# Patient Record
Sex: Female | Born: 1960 | Race: White | Hispanic: No | Marital: Married | Smoking: Never smoker
Health system: Southern US, Community
[De-identification: ages and names within clinical notes are randomized; demographics above are authoritative.]

## PROBLEM LIST (undated history)

## (undated) DIAGNOSIS — N840 Polyp of corpus uteri: Secondary | ICD-10-CM

## (undated) DIAGNOSIS — N926 Irregular menstruation, unspecified: Secondary | ICD-10-CM

## (undated) DIAGNOSIS — B029 Zoster without complications: Secondary | ICD-10-CM

## (undated) DIAGNOSIS — R87619 Unspecified abnormal cytological findings in specimens from cervix uteri: Secondary | ICD-10-CM

## (undated) DIAGNOSIS — Z87442 Personal history of urinary calculi: Secondary | ICD-10-CM

## (undated) DIAGNOSIS — N200 Calculus of kidney: Secondary | ICD-10-CM

## (undated) DIAGNOSIS — Z9289 Personal history of other medical treatment: Secondary | ICD-10-CM

## (undated) DIAGNOSIS — K7689 Other specified diseases of liver: Secondary | ICD-10-CM

## (undated) DIAGNOSIS — Z8619 Personal history of other infectious and parasitic diseases: Secondary | ICD-10-CM

## (undated) DIAGNOSIS — L739 Follicular disorder, unspecified: Secondary | ICD-10-CM

## (undated) DIAGNOSIS — U071 COVID-19: Secondary | ICD-10-CM

## (undated) HISTORY — DX: COVID-19: U07.1

## (undated) HISTORY — DX: Unspecified abnormal cytological findings in specimens from cervix uteri: R87.619

## (undated) HISTORY — DX: Follicular disorder, unspecified: L73.9

## (undated) HISTORY — PX: HAND SURGERY: SHX662

## (undated) HISTORY — DX: Zoster without complications: B02.9

## (undated) HISTORY — DX: Personal history of other medical treatment: Z92.89

## (undated) HISTORY — DX: Personal history of other infectious and parasitic diseases: Z86.19

## (undated) HISTORY — PX: FOOT SURGERY: SHX648

## (undated) HISTORY — DX: Calculus of kidney: N20.0

## (undated) HISTORY — PX: KNEE ARTHROSCOPY: SUR90

## (undated) HISTORY — PX: DILATION AND CURETTAGE OF UTERUS: SHX78

## (undated) HISTORY — DX: Polyp of corpus uteri: N84.0

## (undated) HISTORY — PX: CERVICAL POLYPECTOMY: SHX88

## (undated) HISTORY — PX: HERNIA REPAIR: SHX51

## (undated) HISTORY — DX: Irregular menstruation, unspecified: N92.6

## (undated) HISTORY — PX: TUBAL LIGATION: SHX77

---

## 2000-06-25 DIAGNOSIS — R87619 Unspecified abnormal cytological findings in specimens from cervix uteri: Secondary | ICD-10-CM

## 2000-06-25 HISTORY — PX: CRYOTHERAPY: SHX1416

## 2000-06-25 HISTORY — PX: COLPOSCOPY: SHX161

## 2000-06-25 HISTORY — DX: Unspecified abnormal cytological findings in specimens from cervix uteri: R87.619

## 2011-03-14 DIAGNOSIS — Z9289 Personal history of other medical treatment: Secondary | ICD-10-CM

## 2011-03-14 HISTORY — DX: Personal history of other medical treatment: Z92.89

## 2011-05-08 ENCOUNTER — Ambulatory Visit: Payer: Self-pay | Admitting: Family Medicine

## 2011-05-08 DIAGNOSIS — Z9289 Personal history of other medical treatment: Secondary | ICD-10-CM

## 2011-05-08 HISTORY — DX: Personal history of other medical treatment: Z92.89

## 2012-06-12 ENCOUNTER — Ambulatory Visit: Payer: Self-pay | Admitting: Family Medicine

## 2012-06-16 ENCOUNTER — Ambulatory Visit: Payer: Self-pay | Admitting: Family Medicine

## 2013-07-22 ENCOUNTER — Ambulatory Visit: Payer: Self-pay | Admitting: Family Medicine

## 2013-11-02 ENCOUNTER — Ambulatory Visit: Payer: Self-pay | Admitting: Obstetrics and Gynecology

## 2013-11-11 ENCOUNTER — Ambulatory Visit: Payer: Self-pay | Admitting: Obstetrics and Gynecology

## 2014-05-18 ENCOUNTER — Ambulatory Visit: Payer: Self-pay | Admitting: Obstetrics and Gynecology

## 2014-09-27 ENCOUNTER — Emergency Department
Admit: 2014-09-27 | Disposition: A | Discharge status: Home / Self Care | Payer: Self-pay | Admitting: Emergency Medicine

## 2014-09-27 LAB — CBC
HCT: 38.7 % (ref 35.0–47.0)
HGB: 12.6 g/dL (ref 12.0–16.0)
MCH: 31.1 pg (ref 26.0–34.0)
MCHC: 32.6 g/dL (ref 32.0–36.0)
MCV: 96 fL (ref 80–100)
Platelet: 222 10*3/uL (ref 150–440)
RBC: 4.06 10*6/uL (ref 3.80–5.20)
RDW: 13 % (ref 11.5–14.5)
WBC: 5.6 10*3/uL (ref 3.6–11.0)

## 2014-12-03 ENCOUNTER — Other Ambulatory Visit: Payer: Self-pay | Admitting: Obstetrics & Gynecology

## 2014-12-03 DIAGNOSIS — N63 Unspecified lump in unspecified breast: Secondary | ICD-10-CM

## 2014-12-13 ENCOUNTER — Other Ambulatory Visit: Payer: Self-pay

## 2014-12-13 ENCOUNTER — Encounter: Payer: Self-pay | Admitting: *Deleted

## 2014-12-13 DIAGNOSIS — Z808 Family history of malignant neoplasm of other organs or systems: Secondary | ICD-10-CM | POA: Diagnosis not present

## 2014-12-13 DIAGNOSIS — N920 Excessive and frequent menstruation with regular cycle: Secondary | ICD-10-CM | POA: Diagnosis not present

## 2014-12-13 DIAGNOSIS — Z803 Family history of malignant neoplasm of breast: Secondary | ICD-10-CM | POA: Diagnosis not present

## 2014-12-13 DIAGNOSIS — N938 Other specified abnormal uterine and vaginal bleeding: Secondary | ICD-10-CM | POA: Diagnosis present

## 2014-12-13 DIAGNOSIS — Z882 Allergy status to sulfonamides status: Secondary | ICD-10-CM | POA: Diagnosis not present

## 2014-12-13 DIAGNOSIS — Z79899 Other long term (current) drug therapy: Secondary | ICD-10-CM | POA: Diagnosis not present

## 2014-12-13 DIAGNOSIS — Z91010 Allergy to peanuts: Secondary | ICD-10-CM | POA: Diagnosis not present

## 2014-12-13 DIAGNOSIS — N926 Irregular menstruation, unspecified: Secondary | ICD-10-CM | POA: Diagnosis not present

## 2014-12-13 NOTE — Patient Instructions (Signed)
  Your procedure is scheduled on: 12/21/14 Report to Day Surgery. To find out your arrival time please call [phone] between 1PM - 3PM on 12/20/14.  Remember: Instructions that are not followed completely may result in serious medical risk, up to and including death, or upon the discretion of your surgeon and anesthesiologist your surgery may need to be rescheduled.    __x__ 1. Do not eat food or drink liquids after midnight. No gum chewing or hard candies.     __x__ 2. No Alcohol for 24 hours before or after surgery.   ____ 3. Bring all medications with you on the day of surgery if instructed.    __x__ 4. Notify your doctor if there is any change in your medical condition     (cold, fever, infections).     Do not wear jewelry, make-up, hairpins, clips or nail polish.  Do not wear lotions, powders, or perfumes. You may wear deodorant.  Do not shave 48 hours prior to surgery. Men may shave face and neck.  Do not bring valuables to the hospital.    [facility] is not responsible for any belongings or valuables.               Contacts, dentures or bridgework may not be worn into surgery.  Leave your suitcase in the car. After surgery it may be brought to your room.  For patients admitted to the hospital, discharge time is determined by your                treatment team.   Patients discharged the day of surgery will not be allowed to drive home.   Please read over the following fact sheets that you were given:   Surgical Site Infection Prevention   ____ Take these medicines the morning of surgery with A SIP OF WATER:    1.   2.   3.   4.  5.  6.  ____ Fleet Enema (as directed)   ____ Use CHG Soap as directed  ____ Use inhalers on the day of surgery  ____ Stop metformin 2 days prior to surgery    ____ Take 1/2 of usual insulin dose the night before surgery and none on the morning of surgery.   ____ Stop Coumadin/Plavix/aspirin on ____ Stop Anti-inflammatories on     ____ Stop supplements until after surgery.    ____ Bring C-Pap to the hospital.

## 2014-12-15 ENCOUNTER — Other Ambulatory Visit: Payer: Self-pay

## 2014-12-15 ENCOUNTER — Ambulatory Visit: Payer: Self-pay

## 2014-12-20 ENCOUNTER — Encounter
Admission: RE | Admit: 2014-12-20 | Discharge: 2014-12-20 | Disposition: A | Discharge status: Home / Self Care | Payer: BLUE CROSS/BLUE SHIELD | Source: Ambulatory Visit | Attending: Obstetrics & Gynecology | Admitting: Obstetrics & Gynecology

## 2014-12-20 DIAGNOSIS — N926 Irregular menstruation, unspecified: Secondary | ICD-10-CM | POA: Diagnosis not present

## 2014-12-20 LAB — TYPE AND SCREEN
ABO/RH(D): O POS
ANTIBODY SCREEN: NEGATIVE

## 2014-12-20 LAB — ABO/RH: ABO/RH(D): O POS

## 2014-12-21 ENCOUNTER — Ambulatory Visit
Admission: RE | Admit: 2014-12-21 | Discharge: 2014-12-21 | Disposition: A | Discharge status: Home / Self Care | Payer: BLUE CROSS/BLUE SHIELD | Source: Ambulatory Visit | Attending: Obstetrics & Gynecology | Admitting: Obstetrics & Gynecology

## 2014-12-21 ENCOUNTER — Encounter: Payer: Self-pay | Admitting: *Deleted

## 2014-12-21 ENCOUNTER — Encounter
Admission: RE | Disposition: A | Discharge status: Home / Self Care | Payer: Self-pay | Source: Ambulatory Visit | Attending: Obstetrics & Gynecology

## 2014-12-21 ENCOUNTER — Ambulatory Visit: Payer: BLUE CROSS/BLUE SHIELD | Admitting: Anesthesiology

## 2014-12-21 DIAGNOSIS — Z91010 Allergy to peanuts: Secondary | ICD-10-CM | POA: Insufficient documentation

## 2014-12-21 DIAGNOSIS — Z882 Allergy status to sulfonamides status: Secondary | ICD-10-CM | POA: Insufficient documentation

## 2014-12-21 DIAGNOSIS — N926 Irregular menstruation, unspecified: Secondary | ICD-10-CM | POA: Insufficient documentation

## 2014-12-21 DIAGNOSIS — Z803 Family history of malignant neoplasm of breast: Secondary | ICD-10-CM | POA: Insufficient documentation

## 2014-12-21 DIAGNOSIS — Z808 Family history of malignant neoplasm of other organs or systems: Secondary | ICD-10-CM | POA: Insufficient documentation

## 2014-12-21 DIAGNOSIS — N920 Excessive and frequent menstruation with regular cycle: Secondary | ICD-10-CM | POA: Insufficient documentation

## 2014-12-21 DIAGNOSIS — Z79899 Other long term (current) drug therapy: Secondary | ICD-10-CM | POA: Insufficient documentation

## 2014-12-21 HISTORY — DX: Other specified diseases of liver: K76.89

## 2014-12-21 HISTORY — PX: HYSTEROSCOPY WITH D & C: SHX1775

## 2014-12-21 LAB — POCT PREGNANCY, URINE: PREG TEST UR: NEGATIVE

## 2014-12-21 SURGERY — DILATATION AND CURETTAGE /HYSTEROSCOPY
Anesthesia: General | Wound class: Clean Contaminated

## 2014-12-21 MED ORDER — LACTATED RINGERS IV SOLN
INTRAVENOUS | Status: DC
  Administered 2014-12-21: 10:00:00 via INTRAVENOUS

## 2014-12-21 MED ORDER — EPHEDRINE SULFATE 50 MG/ML IJ SOLN
INTRAMUSCULAR | Status: DC | PRN
  Administered 2014-12-21 (×2): 5 mg via INTRAVENOUS

## 2014-12-21 MED ORDER — FENTANYL CITRATE (PF) 100 MCG/2ML IJ SOLN
INTRAMUSCULAR | Status: AC
  Administered 2014-12-21: 25 ug via INTRAVENOUS
  Filled 2014-12-21: qty 2

## 2014-12-21 MED ORDER — FENTANYL CITRATE (PF) 100 MCG/2ML IJ SOLN
INTRAMUSCULAR | Status: DC | PRN
  Administered 2014-12-21 (×2): 25 ug via INTRAVENOUS

## 2014-12-21 MED ORDER — DEXAMETHASONE SODIUM PHOSPHATE 4 MG/ML IJ SOLN
INTRAMUSCULAR | Status: DC | PRN
  Administered 2014-12-21: 5 mg via INTRAVENOUS

## 2014-12-21 MED ORDER — LIDOCAINE HCL (CARDIAC) 20 MG/ML IV SOLN
INTRAVENOUS | Status: DC | PRN
  Administered 2014-12-21: 80 mg via INTRAVENOUS

## 2014-12-21 MED ORDER — ONDANSETRON HCL 4 MG/2ML IJ SOLN
INTRAMUSCULAR | Status: DC | PRN
  Administered 2014-12-21: 4 mg via INTRAVENOUS

## 2014-12-21 MED ORDER — FENTANYL CITRATE (PF) 100 MCG/2ML IJ SOLN: 25.0000 ug | INTRAMUSCULAR | Status: DC | PRN

## 2014-12-21 MED ORDER — MIDAZOLAM HCL 2 MG/2ML IJ SOLN
INTRAMUSCULAR | Status: DC | PRN
  Administered 2014-12-21: 2 mg via INTRAVENOUS

## 2014-12-21 MED ORDER — FAMOTIDINE 20 MG PO TABS
20.0000 mg | ORAL_TABLET | Freq: Once | ORAL | Status: DC
Start: 2014-12-21 — End: 2014-12-21

## 2014-12-21 MED ORDER — LIDOCAINE HCL (PF) 1 % IJ SOLN
INTRAMUSCULAR | Status: AC
  Filled 2014-12-21: qty 30

## 2014-12-21 MED ORDER — LACTATED RINGERS IR SOLN
Status: DC | PRN
  Administered 2014-12-21: 500 mL

## 2014-12-21 MED ORDER — FENTANYL CITRATE (PF) 100 MCG/2ML IJ SOLN
25.0000 ug | INTRAMUSCULAR | Status: DC | PRN
  Administered 2014-12-21 (×2): 25 ug via INTRAVENOUS

## 2014-12-21 MED ORDER — LIDOCAINE HCL 1 % IJ SOLN
INTRAMUSCULAR | Status: DC | PRN
  Administered 2014-12-21: 10 mL

## 2014-12-21 MED ORDER — FAMOTIDINE 20 MG PO TABS
ORAL_TABLET | ORAL | Status: AC
Start: 2014-12-21 — End: 2014-12-21
  Administered 2014-12-21: 20 mg
  Filled 2014-12-21: qty 1

## 2014-12-21 MED ORDER — PROPOFOL 10 MG/ML IV BOLUS
INTRAVENOUS | Status: DC | PRN
  Administered 2014-12-21: 200 mg via INTRAVENOUS

## 2014-12-21 MED ORDER — ONDANSETRON HCL 4 MG/2ML IJ SOLN: 4.0000 mg | Freq: Once | INTRAMUSCULAR | Status: DC | PRN

## 2014-12-21 SURGICAL SUPPLY — 19 items
CATH ROBINSON RED A/P 16FR (CATHETERS) ×3 IMPLANT
CORD URO TURP 10FT (MISCELLANEOUS) IMPLANT
ELECT RESECT POWERBALL 24F (MISCELLANEOUS) IMPLANT
GLOVE BIOGEL PI IND STRL 6.5 (GLOVE) ×2 IMPLANT
GLOVE BIOGEL PI INDICATOR 6.5 (GLOVE) ×4
GLOVE SURG SYN 6.5 ES PF (GLOVE) ×9 IMPLANT
GOWN STRL REUS W/ TWL LRG LVL3 (GOWN DISPOSABLE) ×2 IMPLANT
GOWN STRL REUS W/TWL LRG LVL3 (GOWN DISPOSABLE) ×4
IV LACTATED RINGERS 1000ML (IV SOLUTION) ×3 IMPLANT
KIT RM TURNOVER CYSTO AR (KITS) ×3 IMPLANT
NEEDLE SPNL 22GX3.5 QUINCKE BK (NEEDLE) ×3 IMPLANT
PACK DNC HYST (MISCELLANEOUS) ×3 IMPLANT
PAD GROUND ADULT SPLIT (MISCELLANEOUS) ×3 IMPLANT
PAD OB MATERNITY 4.3X12.25 (PERSONAL CARE ITEMS) ×3 IMPLANT
PAD PREP 24X41 OB/GYN DISP (PERSONAL CARE ITEMS) ×3 IMPLANT
SYR CONTROL 10ML (SYRINGE) ×3 IMPLANT
TUBING CONNECTING 10 (TUBING) ×2 IMPLANT
TUBING CONNECTING 10' (TUBING) ×1
TUBING HYSTEROSCOPY DOLPHIN (MISCELLANEOUS) IMPLANT

## 2014-12-21 NOTE — Op Note (Signed)
Operative Report Hysteroscopy, Dilation and Curettage 12/21/2014  Patient:  Karen Chapman  54 y.o. female Preoperative diagnosis:  IRREGULAR BLEEDING Postoperative diagnosis:  IRREGULAR BLEEDING  PROCEDURE:  Procedure(s): DILATATION AND CURETTAGE /HYSTEROSCOPY (N/A) Surgeon:  Surgeon(s) and Role:    * Kaydan Wilhoite Loletha Grayer Laryah Neuser, MD - Primary  Anesthesia:  LMA I/O: Total I/O In: 700 [I.V.:700] Out: - EBL minimal, no UOP Specimens:  Endometrial curettings, Endocervical curettings Complications: None Apparent Disposition:  VS stable to PACU  Findings: Uterus, mobile, normal size, sounding to 9 cm; normal cervix, vagina, perineum. Fluffy endometrium on hysteroscope, no polyp, patent tubal ostea.  Indication for procedure/Consents: 54 y.o.  here for scheduled surgery for the aforementioned diagnoses.  Risks of surgery were discussed with the patient including but not limited to: bleeding which may require transfusion; infection which may require antibiotics; injury to uterus or surrounding organs; intrauterine scarring which may impair future fertility; need for additional procedures including laparotomy or laparoscopy; and other postoperative/anesthesia complications. Written informed consent was obtained.    Procedure Details:   The patient was then taken to the operating room where anesthesia was administered and was found to be adequate.  After a formal and adequate timeout was performed, she was placed in the dorsal lithotomy position and examined with the above findings. She was then prepped and draped in the sterile manner.  A speculum was then placed in the patient's vagina and a single tooth tenaculum was applied to the anterior lip of the cervix.  A paracervical block was placed.  An endocervical curettage was performed. The uterus was sounded to 9 cm. Her cervix was serially dilated to accommodate the hysteroscope, with findings as above. A sharp curettage was then performed until there was a  gritty texture in all four quadrants. The specimens were handed off to nursing.  The camera was reinserted and confirmed the uterus had been evacuated. The tenaculum was removed from the anterior lip of the cervix and the vaginal speculum was removed after noting good hemostasis. The patient tolerated the procedure well and was taken to the recovery area awake, extubated and in stable condition.  The patient will be discharged to home as per PACU criteria.  Routine postoperative instructions given. She will follow up in the clinic in two to four weeks for postoperative evaluation.  Larey Days, MD [facility] Attending Gynecologist

## 2014-12-21 NOTE — Anesthesia Postprocedure Evaluation (Signed)
  Anesthesia Post-op Note  Patient: Karen Chapman  Procedure(s) Performed: Procedure(s): DILATATION AND CURETTAGE /HYSTEROSCOPY (N/A)  Anesthesia type:General LMA  Patient location: PACU  Post pain: Pain level controlled  Post assessment: Post-op Vital signs reviewed, Patient's Cardiovascular Status Stable, Respiratory Function Stable, Patent Airway and No signs of Nausea or vomiting  Post vital signs: Reviewed and stable  Last Vitals:  Filed Vitals:   12/21/14 1202  BP: 133/69  Pulse: 60  Temp:   Resp: 16    Level of consciousness: awake, alert  and patient cooperative  Complications: No apparent anesthesia complications

## 2014-12-21 NOTE — Progress Notes (Signed)
H&P Update  Pt was last seen in my office, and complete history and physical performed.  The surgical history has been reviewed and remains accurate without interval change. The patient was re-examined and patient's physiologic condition has not changed significantly in the last 30 days.  No new pharmacological allergies or types of therapy has been initiated.  Allergies  Allergen Reactions  . Chocolate   . Citrus   . Motrin [Ibuprofen] Hives  . Peanut-Containing Drug Products Hives  . Tomato   . Sulfa Antibiotics Rash   @CURRENTMEDS @ Past Medical History  Diagnosis Date  . Liver cyst    Past Surgical History  Procedure Laterality Date  . Cesarean section    . Dilation and curettage of uterus    . Foot surgery    . Hand surgery    . Hernia repair    . Knee arthroscopy    . Tubal ligation      BP 141/87 mmHg  Pulse 66  Temp(Src) 97.8 F (36.6 C) (Oral)  Resp 16  Ht 5' 6.6" (1.692 m)  Wt 71.668 kg (158 lb)  BMI 25.03 kg/m2  SpO2 100%  LMP 11/28/2014 (Exact Date)  NAD RRR no murmurs CTAB, no wheezing, resps unlabored +BS, soft, NTTP No c/c/e Pelvic exam deferred  The above history was confirmed with the patient. The condition still exists that makes this procedure necessary. Surgical plan includes D&C, hysteroscopy, polypectomy as confirmed on the consent. The treatment plan remains the same, without new options for care.  The patient understands the potential benefits and risks and the consents have been signed and placed on the chart.     Larey Days, MD Attending Obstetrician Gynecologist [facility]

## 2014-12-21 NOTE — Anesthesia Preprocedure Evaluation (Signed)
Anesthesia Evaluation  Patient identified by MRN, date of birth, ID band Patient awake    Reviewed: Allergy & Precautions, H&P , NPO status , Patient's Chart, lab work & pertinent test results, reviewed documented beta blocker date and time   Airway Mallampati: II  TM Distance: >3 FB Neck ROM: full    Dental   Pulmonary Current Smoker,          Cardiovascular Rate:Normal     Neuro/Psych    GI/Hepatic   Endo/Other    Renal/GU      Musculoskeletal   Abdominal   Peds  Hematology   Anesthesia Other Findings   Reproductive/Obstetrics                             Anesthesia Physical Anesthesia Plan  ASA: II  Anesthesia Plan: General LMA   Post-op Pain Management:    Induction:   Airway Management Planned:   Additional Equipment:   Intra-op Plan:   Post-operative Plan:   Informed Consent: I have reviewed the patients History and Physical, chart, labs and discussed the procedure including the risks, benefits and alternatives for the proposed anesthesia with the patient or authorized representative who has indicated his/her understanding and acceptance.     Plan Discussed with: CRNA  Anesthesia Plan Comments:         Anesthesia Quick Evaluation

## 2014-12-21 NOTE — Discharge Instructions (Signed)
You should expect to have some cramping and vaginal bleeding for about a week. This should taper off and subside, much like a period. If heavy bleeding continues or gets worse, you should contact the office for an earlier appointment. Use heating pads and tylenol for pain/cramping as needed.  Please call the office or physician on call for fever >101, severe pain, and heavy bleeding.   [phone]

## 2014-12-21 NOTE — Transfer of Care (Signed)
Immediate Anesthesia Transfer of Care Note  Patient: Karen Chapman  Procedure(s) Performed: Procedure(s): DILATATION AND CURETTAGE /HYSTEROSCOPY (N/A)  Patient Location: PACU  Anesthesia Type:General  Level of Consciousness: sedated  Airway & Oxygen Therapy: Patient Spontanous Breathing and Patient connected to face mask oxygen  Post-op Assessment: Report given to RN and Post -op Vital signs reviewed and stable  Post vital signs: Reviewed and stable  Last Vitals:  Filed Vitals:   12/21/14 1045  BP: 112/77  Pulse: 69  Temp: 36.1 C  Resp: 15    Complications: No apparent anesthesia complications

## 2014-12-22 LAB — SURGICAL PATHOLOGY

## 2014-12-23 ENCOUNTER — Ambulatory Visit: Payer: Self-pay

## 2014-12-23 ENCOUNTER — Ambulatory Visit
Admission: RE | Admit: 2014-12-23 | Discharge: 2014-12-23 | Disposition: A | Discharge status: Home / Self Care | Payer: BLUE CROSS/BLUE SHIELD | Source: Ambulatory Visit | Attending: Obstetrics & Gynecology | Admitting: Obstetrics & Gynecology

## 2014-12-23 DIAGNOSIS — Z803 Family history of malignant neoplasm of breast: Secondary | ICD-10-CM | POA: Insufficient documentation

## 2014-12-23 DIAGNOSIS — Z09 Encounter for follow-up examination after completed treatment for conditions other than malignant neoplasm: Secondary | ICD-10-CM | POA: Insufficient documentation

## 2014-12-23 DIAGNOSIS — N63 Unspecified lump in unspecified breast: Secondary | ICD-10-CM

## 2014-12-23 DIAGNOSIS — N6489 Other specified disorders of breast: Secondary | ICD-10-CM | POA: Insufficient documentation

## 2015-09-29 DIAGNOSIS — M9903 Segmental and somatic dysfunction of lumbar region: Secondary | ICD-10-CM | POA: Diagnosis not present

## 2015-09-29 DIAGNOSIS — M5416 Radiculopathy, lumbar region: Secondary | ICD-10-CM | POA: Diagnosis not present

## 2015-09-29 DIAGNOSIS — M62830 Muscle spasm of back: Secondary | ICD-10-CM | POA: Diagnosis not present

## 2015-09-29 DIAGNOSIS — M9905 Segmental and somatic dysfunction of pelvic region: Secondary | ICD-10-CM | POA: Diagnosis not present

## 2015-10-06 DIAGNOSIS — M5416 Radiculopathy, lumbar region: Secondary | ICD-10-CM | POA: Diagnosis not present

## 2015-10-06 DIAGNOSIS — M9905 Segmental and somatic dysfunction of pelvic region: Secondary | ICD-10-CM | POA: Diagnosis not present

## 2015-10-06 DIAGNOSIS — M62830 Muscle spasm of back: Secondary | ICD-10-CM | POA: Diagnosis not present

## 2015-10-06 DIAGNOSIS — M9903 Segmental and somatic dysfunction of lumbar region: Secondary | ICD-10-CM | POA: Diagnosis not present

## 2015-12-01 ENCOUNTER — Other Ambulatory Visit: Payer: Self-pay | Admitting: Obstetrics & Gynecology

## 2015-12-01 DIAGNOSIS — Z1231 Encounter for screening mammogram for malignant neoplasm of breast: Secondary | ICD-10-CM

## 2015-12-29 ENCOUNTER — Ambulatory Visit
Admission: RE | Admit: 2015-12-29 | Discharge: 2015-12-29 | Disposition: A | Discharge status: Home / Self Care | Payer: BLUE CROSS/BLUE SHIELD | Source: Ambulatory Visit | Attending: Obstetrics & Gynecology | Admitting: Obstetrics & Gynecology

## 2015-12-29 ENCOUNTER — Other Ambulatory Visit: Payer: Self-pay | Admitting: Obstetrics & Gynecology

## 2015-12-29 DIAGNOSIS — Z1231 Encounter for screening mammogram for malignant neoplasm of breast: Secondary | ICD-10-CM

## 2015-12-29 LAB — HM MAMMOGRAPHY

## 2016-01-06 DIAGNOSIS — Z01419 Encounter for gynecological examination (general) (routine) without abnormal findings: Secondary | ICD-10-CM | POA: Diagnosis not present

## 2016-01-06 DIAGNOSIS — Z1211 Encounter for screening for malignant neoplasm of colon: Secondary | ICD-10-CM | POA: Diagnosis not present

## 2016-01-06 DIAGNOSIS — Z1329 Encounter for screening for other suspected endocrine disorder: Secondary | ICD-10-CM | POA: Diagnosis not present

## 2016-01-06 DIAGNOSIS — Z0000 Encounter for general adult medical examination without abnormal findings: Secondary | ICD-10-CM | POA: Diagnosis not present

## 2016-01-06 DIAGNOSIS — Z13220 Encounter for screening for lipoid disorders: Secondary | ICD-10-CM | POA: Diagnosis not present

## 2016-01-06 LAB — HM PAP SMEAR

## 2016-01-23 ENCOUNTER — Encounter: Payer: Self-pay | Admitting: Family Medicine

## 2016-04-12 ENCOUNTER — Ambulatory Visit (INDEPENDENT_AMBULATORY_CARE_PROVIDER_SITE_OTHER): Payer: BLUE CROSS/BLUE SHIELD | Admitting: Internal Medicine

## 2016-04-12 ENCOUNTER — Encounter: Payer: Self-pay | Admitting: Internal Medicine

## 2016-04-12 VITALS — BP 122/74 | HR 70 | Temp 98.0°F | Ht 66.25 in | Wt 170.8 lb

## 2016-04-12 DIAGNOSIS — J302 Other seasonal allergic rhinitis: Secondary | ICD-10-CM | POA: Insufficient documentation

## 2016-04-12 DIAGNOSIS — R1030 Lower abdominal pain, unspecified: Secondary | ICD-10-CM | POA: Diagnosis not present

## 2016-04-12 DIAGNOSIS — K7689 Other specified diseases of liver: Secondary | ICD-10-CM | POA: Diagnosis not present

## 2016-04-12 DIAGNOSIS — J301 Allergic rhinitis due to pollen: Secondary | ICD-10-CM

## 2016-04-12 NOTE — Addendum Note (Signed)
Addended by: Jearld Fenton on: 04/12/2016 11:37 AM   Modules accepted: Orders

## 2016-04-12 NOTE — Assessment & Plan Note (Signed)
Genetic No further workup needed

## 2016-04-12 NOTE — Patient Instructions (Signed)

## 2016-04-12 NOTE — Progress Notes (Signed)
HPI  Pt presents to the clinic today to establish care and for management of the conditions listed below. She has not had a PCP in many years. She has been following [facility]  Seasonal Allergies: Worse in the Fall. She takes Flonase as needed.  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  She is also c/o lower abdominal pain. This started in January after her hysteroscopy. She describes the pain as achy, cramping and intermittently stabbing. The pain comes and goes. The pain is not affected by eating, urinating or moving her bowels. She has not had a period since February. She has taken Ibuprofen or Tylenol with some relief.   Flu: 03/2015, gets with employer Tetanus: unsure Pap Smear: 12/2015: normal Mammogram: 12/2015: normal at [facility] Colon Screening: never Vision Screening: yearly Dentist: biannually  Past Medical History:  Diagnosis Date  . Liver cyst     Current Outpatient Prescriptions  Medication Sig Dispense Refill  . fluticasone (FLONASE) 50 MCG/ACT nasal spray Place into both nostrils daily as needed.     . Multiple Vitamin (MULTIVITAMIN) tablet Take 1 tablet by mouth daily.    . Vitamin D, Cholecalciferol, 1000 units CAPS Take 1 capsule by mouth daily.     No current facility-administered medications for this visit.     Allergies  Allergen Reactions  . Chocolate   . Citrus   . Motrin [Ibuprofen] Hives  . Peanut-Containing Drug Products Hives  . Tomato   . Sulfa Antibiotics Rash    Family History  Problem Relation Age of Onset  . Breast cancer Maternal Grandmother   . Breast cancer Paternal Grandmother   . Brain cancer Father     Social History   Social History  . Marital status: Married    Spouse name: N/A  . Number of children: N/A  . Years of education: N/A   Occupational History  . Not on file.   Social History Main Topics  . Smoking status: Never Smoker  . Smokeless tobacco: Never Used  . Alcohol use Yes     Comment:  social  . Drug use: No  . Sexual activity: Not on file   Other Topics Concern  . Not on file   Social History Narrative  . No narrative on file    ROS:  Constitutional: Denies fever, malaise, fatigue, headache or abrupt weight changes.  HEENT: Denies eye pain, eye redness, ear pain, ringing in the ears, wax buildup, runny nose, nasal congestion, bloody nose, or sore throat. Respiratory: Denies difficulty breathing, shortness of breath, cough or sputum production.   Cardiovascular: Denies chest pain, chest tightness, palpitations or swelling in the hands or feet.  Gastrointestinal: Pt reports abdominal pain. Denies bloating, constipation, diarrhea or blood in the stool.  GU: Denies frequency, urgency, pain with urination, blood in urine, odor or discharge. Musculoskeletal: Denies decrease in range of motion, difficulty with gait, muscle pain or joint pain and swelling.  Skin: Denies redness, rashes, lesions or ulcercations.  Neurological: Denies dizziness, difficulty with memory, difficulty with speech or problems with balance and coordination.  Psych: Pt reports stress. Denies anxiety, depression, SI/HI.  No other specific complaints in a complete review of systems (except as listed in HPI above).  PE:  BP 122/74   Pulse 70   Temp 98 F (36.7 C) (Oral)   Ht 5' 6.25" (1.683 m)   Wt 170 lb 12 oz (77.5 kg)   LMP 07/28/2015   SpO2 98%   BMI 27.35 kg/m  Wt Readings from Last 3 Encounters:  04/12/16 170 lb 12 oz (77.5 kg)  12/13/14 158 lb (71.7 kg)    General: Appears her stated age, well developed, well nourished in NAD. Cardiovascular: Normal rate and rhythm. S1,S2 noted.  No murmur, rubs or gallops noted.  Pulmonary/Chest: Normal effort and positive vesicular breath sounds. No respiratory distress. No wheezes, rales or ronchi noted.  Abdomen: Soft and mildly tender in the suprapubic area. Normal bowel sounds. No distention or masses noted. Liver, spleen and kidneys non  palpable. Neurological: Alert and oriented.  Psychiatric: She is tearful today, related to life stress.    Assessment and Plan:  Lower abdominal pain:  This has been going on for months She had transvaginal ultrasound at [facility] in Feb-2017- no acute findings Will check CT abd pelvis without contrast Continue Ibuprofen or Tylenol for pain  Will follow up after CT, make an appt for your annual exam Webb Silversmith, NP

## 2016-04-12 NOTE — Assessment & Plan Note (Signed)
Continue Flonase as needed

## 2016-04-19 ENCOUNTER — Encounter: Payer: Self-pay | Admitting: Radiology

## 2016-04-19 ENCOUNTER — Ambulatory Visit
Admission: RE | Admit: 2016-04-19 | Discharge: 2016-04-19 | Disposition: A | Discharge status: Home / Self Care | Payer: BLUE CROSS/BLUE SHIELD | Source: Ambulatory Visit | Attending: Internal Medicine | Admitting: Internal Medicine

## 2016-04-19 DIAGNOSIS — N200 Calculus of kidney: Secondary | ICD-10-CM | POA: Diagnosis not present

## 2016-04-19 DIAGNOSIS — K7689 Other specified diseases of liver: Secondary | ICD-10-CM | POA: Diagnosis not present

## 2016-04-19 DIAGNOSIS — K429 Umbilical hernia without obstruction or gangrene: Secondary | ICD-10-CM | POA: Insufficient documentation

## 2016-04-19 DIAGNOSIS — R1030 Lower abdominal pain, unspecified: Secondary | ICD-10-CM | POA: Insufficient documentation

## 2016-04-19 MED ORDER — IOPAMIDOL (ISOVUE-300) INJECTION 61%
100.0000 mL | Freq: Once | INTRAVENOUS | Status: AC | PRN
  Administered 2016-04-19: 100 mL via INTRAVENOUS

## 2016-04-23 ENCOUNTER — Telehealth: Payer: Self-pay | Admitting: Internal Medicine

## 2016-04-23 NOTE — Telephone Encounter (Signed)
Pt returned your call- please call [phone] Thanks

## 2016-04-26 NOTE — Telephone Encounter (Signed)
Per verbal order--Karen Chapman states that because pt has been having pain since her hysterectomy that she wants to make sure it is not in relation to that as it there could be scar tissue etc and also because the CT scan did not show anything that would be causing the issue

## 2016-04-26 NOTE — Telephone Encounter (Signed)
Pt called has questions?  Pt wanted advise on issues on CT.  She also wanted to know why referring back to gyn  Best number  After 5   [phone] Before 5  [phone]

## 2016-04-27 NOTE — Telephone Encounter (Signed)
Pt reports she has nopt had a hysterectomy only polyp removal---pt also states that she saw her GYN in 12/2015 and Pap was done along with vaginal exam and everything was okay---please advise

## 2016-04-27 NOTE — Telephone Encounter (Signed)
She had a normal CT scan. If she wants further evaluation it would be follow up with GYN or GI (which it seems more GYN related). If she does not want further evaluation, then she can take Tylenol or Ibuprofen for pain

## 2016-04-30 NOTE — Telephone Encounter (Signed)
Patient notified as instructed by telephone and verbalized understanding. Patient stated that she saw her GYN doctor in July, but will contact them again. Patient stated that if she does get the problem taken care of by her GYN she may call back for a referral to a GI doctor.

## 2016-04-30 NOTE — Telephone Encounter (Signed)
Left message on voicemail for patient to call back. 

## 2016-05-03 ENCOUNTER — Ambulatory Visit: Payer: BLUE CROSS/BLUE SHIELD | Admitting: Family Medicine

## 2016-05-08 ENCOUNTER — Telehealth: Payer: Self-pay

## 2016-05-08 NOTE — Telephone Encounter (Signed)
Pt left v/m requesting status of copy of CT abdomen done on 04/19/16. Pt previously requested copy but has not received. Copy mailed to pt at verified home address. Pt notified done as requested.Nothing further needed.

## 2016-06-03 IMAGING — MG MM DIAG BREAST TOMO BILATERAL
8 of 12 series · 8 of 28 positions shown · non-contrast
Comparison: 05/18/2014, 11/11/2013, 11/02/2013, 06/12/2012

CLINICAL DATA: Followup of asymmetry in the superior left breast
felt to be probably benign and secondary to normal breast tissue.
Annual examination of the right breast.

EXAM:
DIGITAL DIAGNOSTIC BILATERAL MAMMOGRAM WITH 3D TOMOSYNTHESIS AND CAD

[L CC synth-2D]
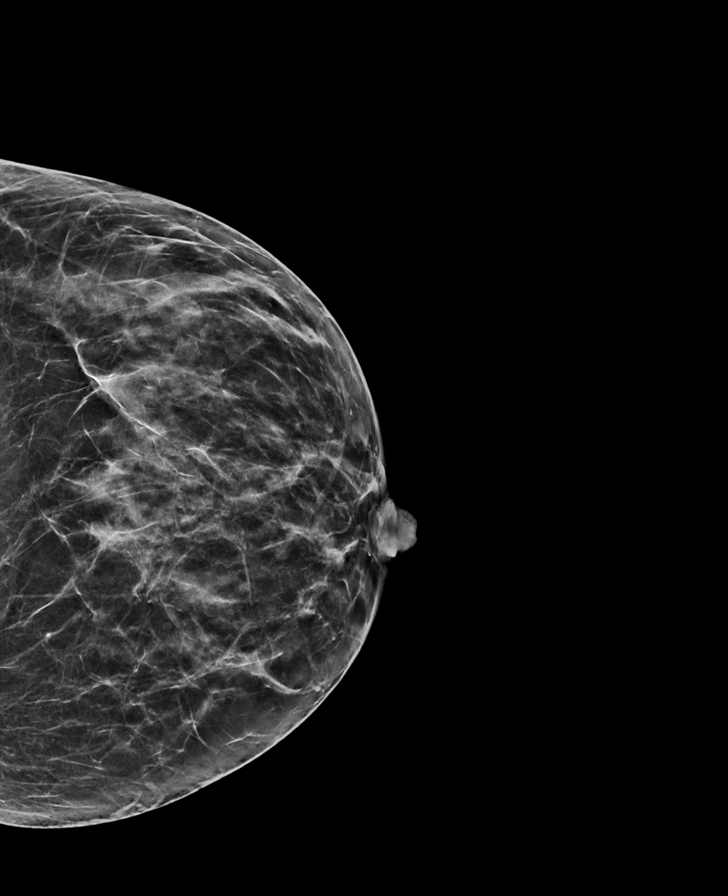

[R CC synth-2D]
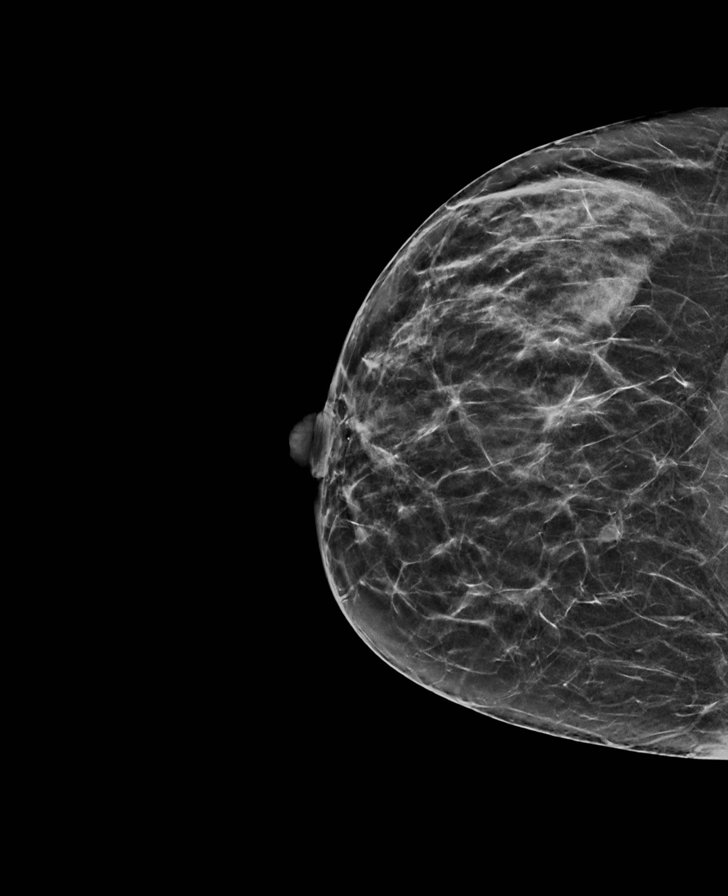

[R CC]
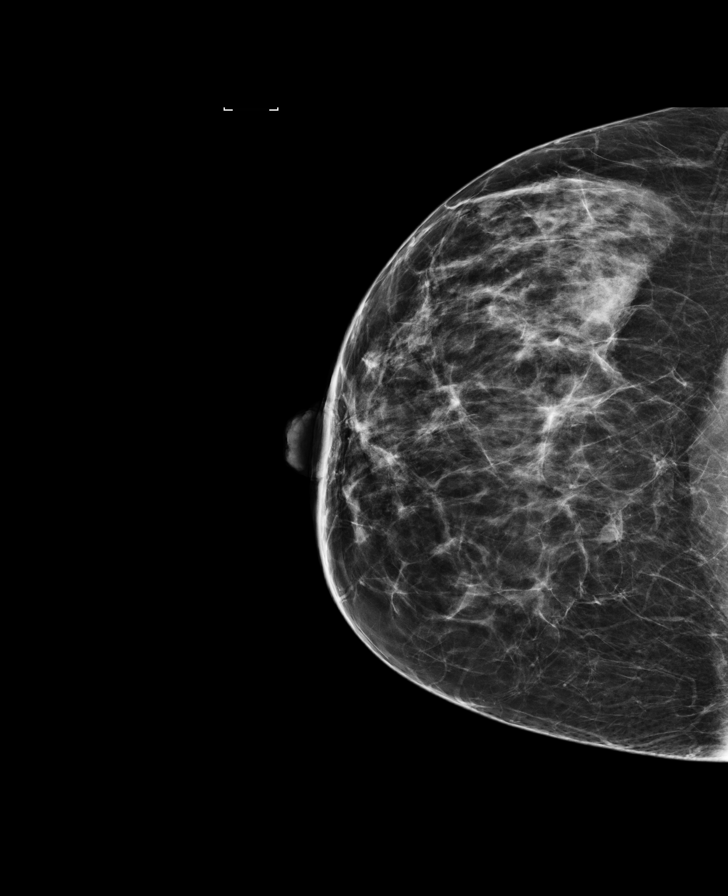

[L CC]
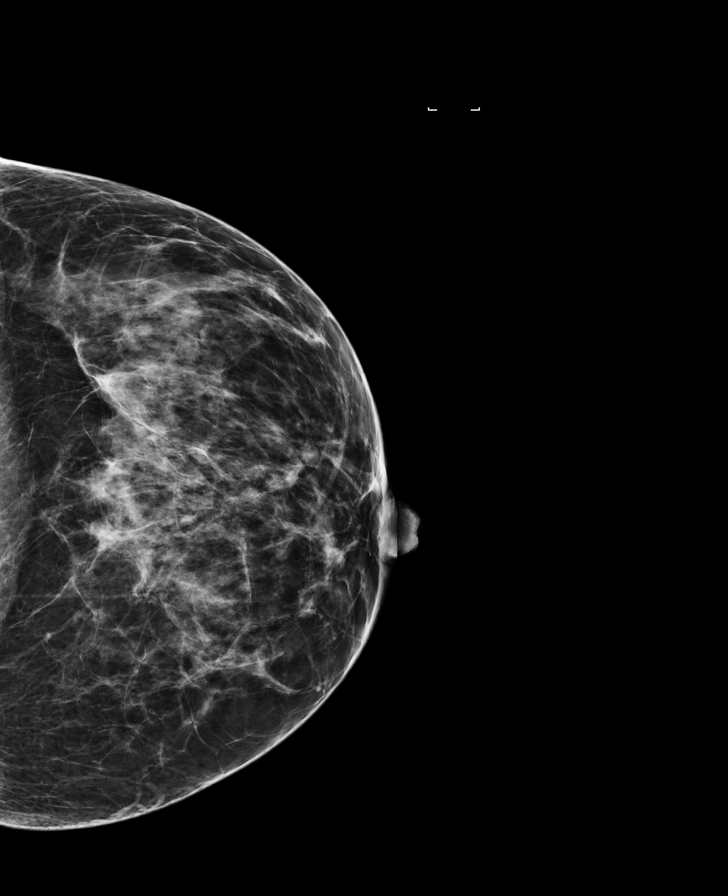

[R MLO synth-2D]
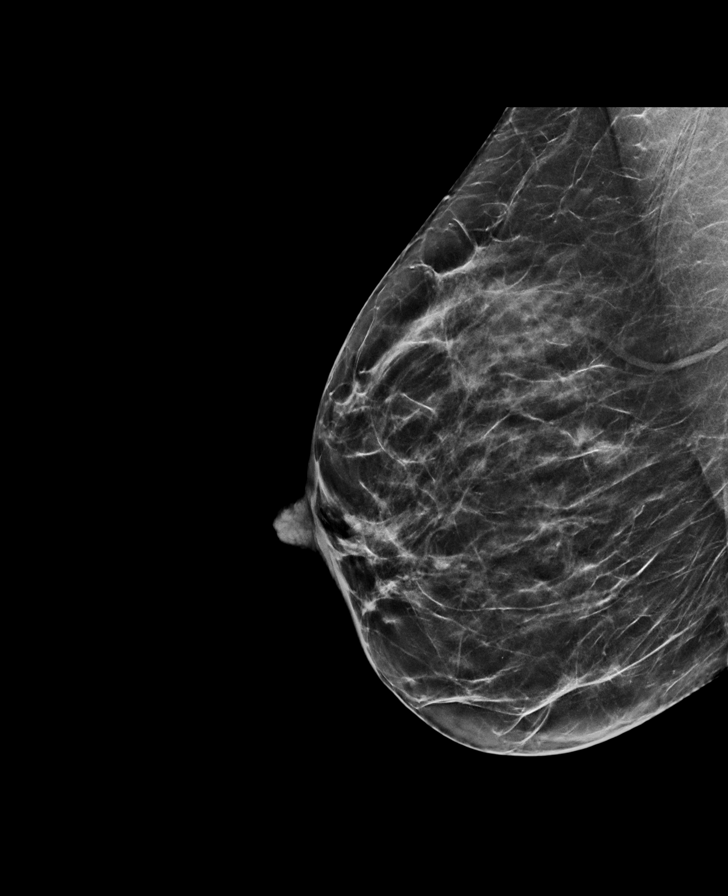

[R MLO]
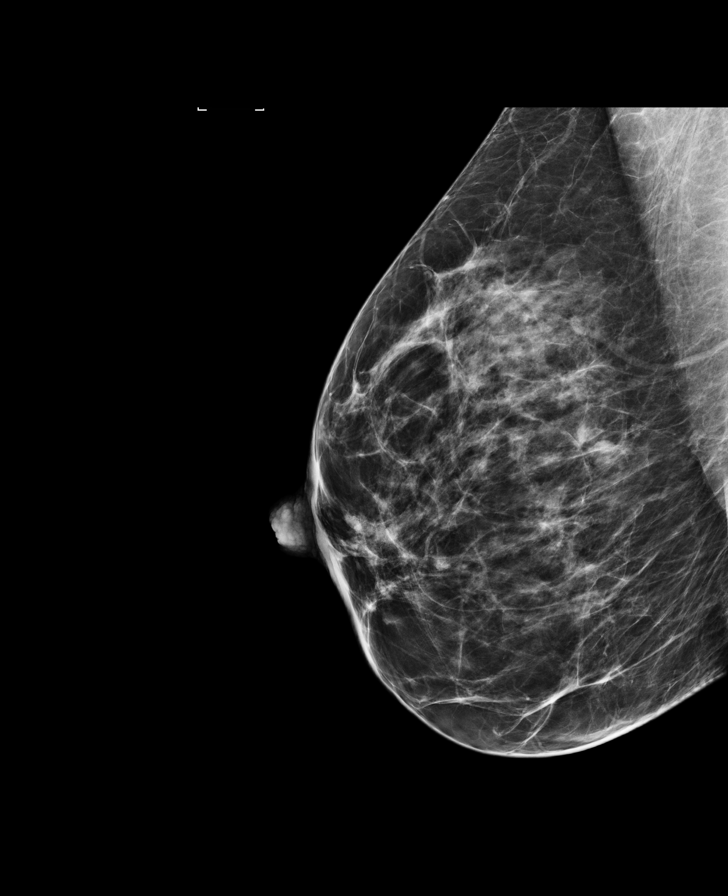

[L MLO synth-2D]
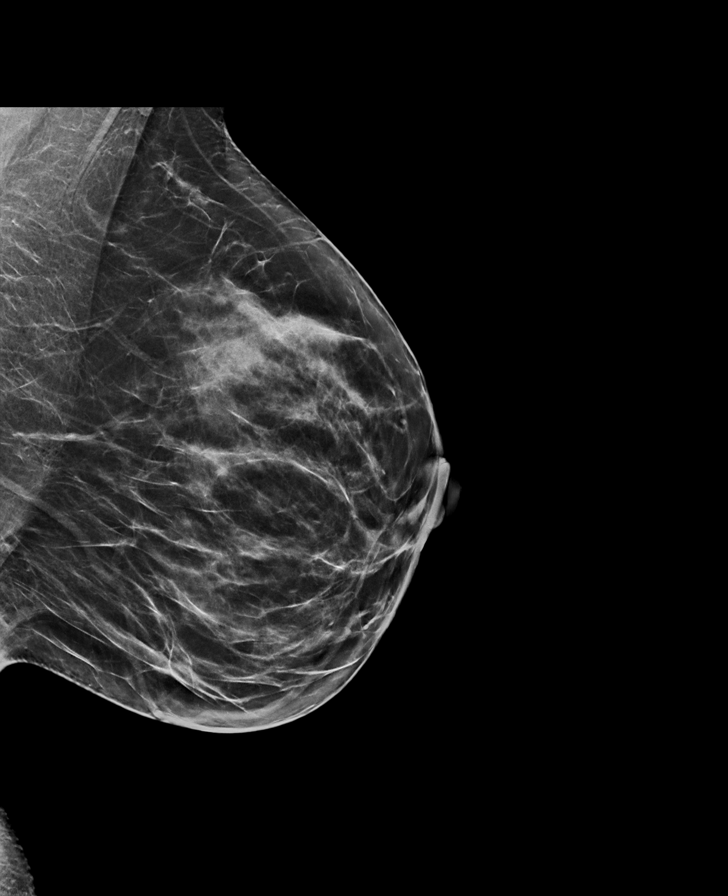

[L MLO]
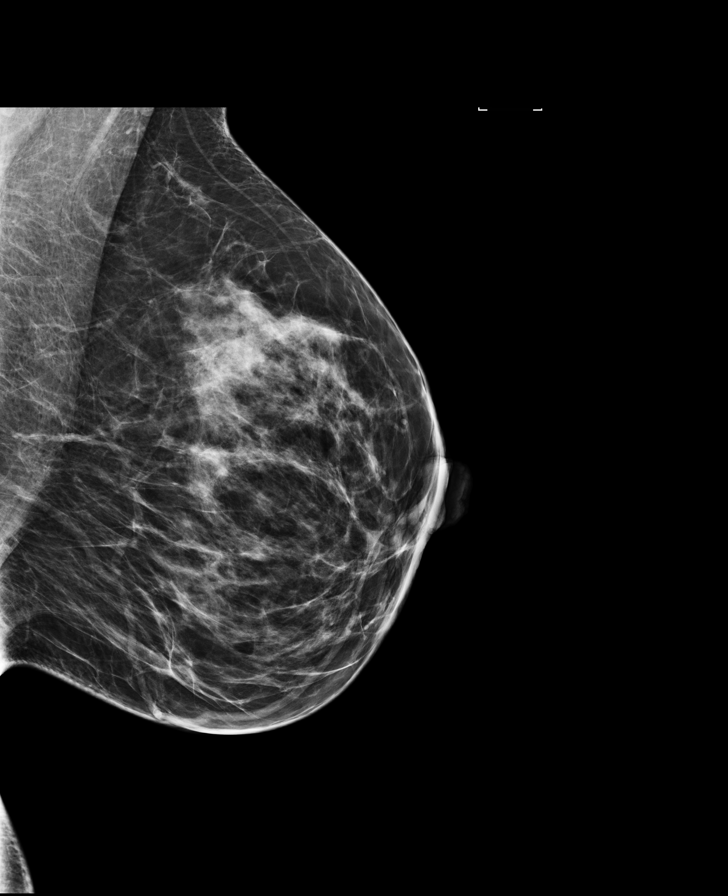

[8 of 28 positions shown; findings below may reference images not displayed]

ACR Breast Density Category b: There are scattered areas of
fibroglandular density.
FINDINGS: Normal appearing glandular tissue is imaged in the far superior left
breast in the region of the previously described asymmetry. No
suspicious mass, distortion, or suspicious microcalcification is
identified in either breast to suggest malignancy.

Mammographic images were processed with CAD.
IMPRESSION: No evidence of malignancy in either breast.

RECOMMENDATION:
Screening mammogram in one year.(Code:KL-I-UKM)

I have discussed the findings and recommendations with the patient.
Results were also provided in writing at the conclusion of the
visit. If applicable, a reminder letter will be sent to the patient
regarding the next appointment.

BI-RADS CATEGORY  1: Negative.

## 2016-07-05 DIAGNOSIS — M5416 Radiculopathy, lumbar region: Secondary | ICD-10-CM | POA: Diagnosis not present

## 2016-07-05 DIAGNOSIS — M62830 Muscle spasm of back: Secondary | ICD-10-CM | POA: Diagnosis not present

## 2016-07-05 DIAGNOSIS — M9905 Segmental and somatic dysfunction of pelvic region: Secondary | ICD-10-CM | POA: Diagnosis not present

## 2016-07-05 DIAGNOSIS — M9903 Segmental and somatic dysfunction of lumbar region: Secondary | ICD-10-CM | POA: Diagnosis not present

## 2016-07-30 ENCOUNTER — Other Ambulatory Visit: Payer: Self-pay | Admitting: Family Medicine

## 2016-07-30 ENCOUNTER — Ambulatory Visit
Admission: RE | Admit: 2016-07-30 | Discharge: 2016-07-30 | Disposition: A | Discharge status: Home / Self Care | Payer: BLUE CROSS/BLUE SHIELD | Source: Ambulatory Visit | Attending: Family Medicine | Admitting: Family Medicine

## 2016-07-30 DIAGNOSIS — S62346A Nondisplaced fracture of base of fifth metacarpal bone, right hand, initial encounter for closed fracture: Secondary | ICD-10-CM | POA: Diagnosis not present

## 2016-07-30 DIAGNOSIS — S62309A Unspecified fracture of unspecified metacarpal bone, initial encounter for closed fracture: Secondary | ICD-10-CM | POA: Insufficient documentation

## 2016-07-30 DIAGNOSIS — R609 Edema, unspecified: Secondary | ICD-10-CM | POA: Diagnosis not present

## 2016-07-30 DIAGNOSIS — R52 Pain, unspecified: Secondary | ICD-10-CM | POA: Insufficient documentation

## 2016-07-30 DIAGNOSIS — S62396A Other fracture of fifth metacarpal bone, right hand, initial encounter for closed fracture: Secondary | ICD-10-CM | POA: Diagnosis not present

## 2016-07-30 DIAGNOSIS — X58XXXA Exposure to other specified factors, initial encounter: Secondary | ICD-10-CM | POA: Insufficient documentation

## 2016-08-16 DIAGNOSIS — M9903 Segmental and somatic dysfunction of lumbar region: Secondary | ICD-10-CM | POA: Diagnosis not present

## 2016-08-16 DIAGNOSIS — M62830 Muscle spasm of back: Secondary | ICD-10-CM | POA: Diagnosis not present

## 2016-08-16 DIAGNOSIS — M5416 Radiculopathy, lumbar region: Secondary | ICD-10-CM | POA: Diagnosis not present

## 2016-08-16 DIAGNOSIS — M9905 Segmental and somatic dysfunction of pelvic region: Secondary | ICD-10-CM | POA: Diagnosis not present

## 2016-08-20 DIAGNOSIS — S62346A Nondisplaced fracture of base of fifth metacarpal bone, right hand, initial encounter for closed fracture: Secondary | ICD-10-CM | POA: Diagnosis not present

## 2016-09-03 DIAGNOSIS — S62346D Nondisplaced fracture of base of fifth metacarpal bone, right hand, subsequent encounter for fracture with routine healing: Secondary | ICD-10-CM | POA: Diagnosis not present

## 2016-09-20 DIAGNOSIS — M9903 Segmental and somatic dysfunction of lumbar region: Secondary | ICD-10-CM | POA: Diagnosis not present

## 2016-09-20 DIAGNOSIS — M9905 Segmental and somatic dysfunction of pelvic region: Secondary | ICD-10-CM | POA: Diagnosis not present

## 2016-09-20 DIAGNOSIS — M5416 Radiculopathy, lumbar region: Secondary | ICD-10-CM | POA: Diagnosis not present

## 2016-09-20 DIAGNOSIS — M62830 Muscle spasm of back: Secondary | ICD-10-CM | POA: Diagnosis not present

## 2016-10-25 DIAGNOSIS — M5416 Radiculopathy, lumbar region: Secondary | ICD-10-CM | POA: Diagnosis not present

## 2016-10-25 DIAGNOSIS — M62830 Muscle spasm of back: Secondary | ICD-10-CM | POA: Diagnosis not present

## 2016-10-25 DIAGNOSIS — M9903 Segmental and somatic dysfunction of lumbar region: Secondary | ICD-10-CM | POA: Diagnosis not present

## 2016-10-25 DIAGNOSIS — M9905 Segmental and somatic dysfunction of pelvic region: Secondary | ICD-10-CM | POA: Diagnosis not present

## 2016-11-14 DIAGNOSIS — B029 Zoster without complications: Secondary | ICD-10-CM | POA: Diagnosis not present

## 2016-11-29 DIAGNOSIS — M9903 Segmental and somatic dysfunction of lumbar region: Secondary | ICD-10-CM | POA: Diagnosis not present

## 2016-11-29 DIAGNOSIS — M5416 Radiculopathy, lumbar region: Secondary | ICD-10-CM | POA: Diagnosis not present

## 2016-11-29 DIAGNOSIS — M62830 Muscle spasm of back: Secondary | ICD-10-CM | POA: Diagnosis not present

## 2016-11-29 DIAGNOSIS — M9905 Segmental and somatic dysfunction of pelvic region: Secondary | ICD-10-CM | POA: Diagnosis not present

## 2016-12-12 ENCOUNTER — Encounter: Payer: Self-pay | Admitting: Obstetrics and Gynecology

## 2016-12-12 ENCOUNTER — Ambulatory Visit (INDEPENDENT_AMBULATORY_CARE_PROVIDER_SITE_OTHER): Payer: BLUE CROSS/BLUE SHIELD | Admitting: Obstetrics and Gynecology

## 2016-12-12 VITALS — BP 126/80 | HR 76 | Ht 66.5 in | Wt 170.0 lb

## 2016-12-12 DIAGNOSIS — N898 Other specified noninflammatory disorders of vagina: Secondary | ICD-10-CM

## 2016-12-12 NOTE — Progress Notes (Signed)
   Chief Complaint  Patient presents with  . vaginal irritation    painful vaginal knot x few days    HPI:      [name] is a 56 y.o. [phone] who LMP was No LMP recorded. Patient is perimenopausal., presents today for a vaginal lesion RT labia for the past few days. It was tender initially and then pt noticed it is black. It may be a little smaller today but still present. She has not noticed any drainage. No new soaps/shaving.    Patient Active Problem List   Diagnosis Date Noted  . Seasonal allergies 04/12/2016  . Liver cyst 04/12/2016    Family History  Problem Relation Age of Onset  . Breast cancer Maternal Grandmother   . Brain cancer Maternal Grandmother   . Breast cancer Paternal Grandmother 60  . Brain cancer Father 78       anaplastic astrocyntoma level 3    Social History   Social History  . Marital status: Married    Spouse name: N/A  . Number of children: 3  . Years of education: N/A   Occupational History  . Not on file.   Social History Main Topics  . Smoking status: Never Smoker  . Smokeless tobacco: Never Used  . Alcohol use Yes     Comment: social  . Drug use: No  . Sexual activity: Yes    Birth control/ protection: Surgical   Other Topics Concern  . Not on file   Social History Narrative  . No narrative on file     Current Outpatient Prescriptions:  .  fluticasone (FLONASE) 50 MCG/ACT nasal spray, Place into both nostrils daily as needed. , Disp: , Rfl:  .  Multiple Vitamin (MULTIVITAMIN) tablet, Take 1 tablet by mouth daily., Disp: , Rfl:  .  Vitamin D, Cholecalciferol, 1000 units CAPS, Take 1 capsule by mouth daily., Disp: , Rfl:   Review of Systems  Constitutional: Negative for fever.  Gastrointestinal: Negative for blood in stool, constipation, diarrhea, nausea and vomiting.  Genitourinary: Positive for genital sores and vaginal pain. Negative for dyspareunia, dysuria, flank pain, frequency, hematuria, urgency, vaginal  bleeding and vaginal discharge.  Musculoskeletal: Negative for back pain.  Skin: Negative for rash.     OBJECTIVE:   Vitals:  BP 126/80 (BP Location: Left Arm, Patient Position: Sitting, Cuff Size: Normal)   Pulse 76   Ht 5' 6.5" (1.689 m)   Wt 170 lb (77.1 kg)   BMI 27.03 kg/m   Physical Exam  Constitutional: She is well-developed, well-nourished, and in no distress.  Genitourinary: Vulva exhibits lesion and tenderness. Vulva exhibits no erythema, no exudate and no rash.  Genitourinary Comments: RT LABIA MAJORA WITH A ~2 MM MILDLY TENDER NODULE; NO D/C, ERYTHEMA, "HEAD", ULCER; DARK SPOT IN MIDDLE THAT IS MOST LIKELY BLOOD VESSEL  Vitals reviewed.    Assessment/Plan: Vaginal lesion - Question resolving folliculitis vs new onset vulvar varicosity. Warm comp to see if it resolves. If not, will prob be vein. Reassurance. F/u at 7/18 annual     Return if symptoms worsen or fail to improve.  Alicia B. Copland, PA-C 12/12/2016 10:24 AM

## 2016-12-14 ENCOUNTER — Other Ambulatory Visit: Payer: Self-pay | Admitting: Obstetrics & Gynecology

## 2016-12-14 DIAGNOSIS — Z1231 Encounter for screening mammogram for malignant neoplasm of breast: Secondary | ICD-10-CM

## 2017-01-01 ENCOUNTER — Ambulatory Visit
Admission: RE | Admit: 2017-01-01 | Discharge: 2017-01-01 | Disposition: A | Discharge status: Home / Self Care | Payer: BLUE CROSS/BLUE SHIELD | Source: Ambulatory Visit | Attending: Obstetrics & Gynecology | Admitting: Obstetrics & Gynecology

## 2017-01-01 DIAGNOSIS — Z1231 Encounter for screening mammogram for malignant neoplasm of breast: Secondary | ICD-10-CM | POA: Insufficient documentation

## 2017-01-03 DIAGNOSIS — M5416 Radiculopathy, lumbar region: Secondary | ICD-10-CM | POA: Diagnosis not present

## 2017-01-03 DIAGNOSIS — M9905 Segmental and somatic dysfunction of pelvic region: Secondary | ICD-10-CM | POA: Diagnosis not present

## 2017-01-03 DIAGNOSIS — M9903 Segmental and somatic dysfunction of lumbar region: Secondary | ICD-10-CM | POA: Diagnosis not present

## 2017-01-03 DIAGNOSIS — M62830 Muscle spasm of back: Secondary | ICD-10-CM | POA: Diagnosis not present

## 2017-01-31 DIAGNOSIS — M5416 Radiculopathy, lumbar region: Secondary | ICD-10-CM | POA: Diagnosis not present

## 2017-01-31 DIAGNOSIS — M9905 Segmental and somatic dysfunction of pelvic region: Secondary | ICD-10-CM | POA: Diagnosis not present

## 2017-01-31 DIAGNOSIS — M9903 Segmental and somatic dysfunction of lumbar region: Secondary | ICD-10-CM | POA: Diagnosis not present

## 2017-01-31 DIAGNOSIS — M62830 Muscle spasm of back: Secondary | ICD-10-CM | POA: Diagnosis not present

## 2017-02-28 DIAGNOSIS — M62830 Muscle spasm of back: Secondary | ICD-10-CM | POA: Diagnosis not present

## 2017-02-28 DIAGNOSIS — M9903 Segmental and somatic dysfunction of lumbar region: Secondary | ICD-10-CM | POA: Diagnosis not present

## 2017-02-28 DIAGNOSIS — M5416 Radiculopathy, lumbar region: Secondary | ICD-10-CM | POA: Diagnosis not present

## 2017-02-28 DIAGNOSIS — M9905 Segmental and somatic dysfunction of pelvic region: Secondary | ICD-10-CM | POA: Diagnosis not present

## 2017-04-04 DIAGNOSIS — M9905 Segmental and somatic dysfunction of pelvic region: Secondary | ICD-10-CM | POA: Diagnosis not present

## 2017-04-04 DIAGNOSIS — M9903 Segmental and somatic dysfunction of lumbar region: Secondary | ICD-10-CM | POA: Diagnosis not present

## 2017-04-04 DIAGNOSIS — M62830 Muscle spasm of back: Secondary | ICD-10-CM | POA: Diagnosis not present

## 2017-04-04 DIAGNOSIS — M5416 Radiculopathy, lumbar region: Secondary | ICD-10-CM | POA: Diagnosis not present

## 2017-05-02 DIAGNOSIS — M9903 Segmental and somatic dysfunction of lumbar region: Secondary | ICD-10-CM | POA: Diagnosis not present

## 2017-05-02 DIAGNOSIS — M62830 Muscle spasm of back: Secondary | ICD-10-CM | POA: Diagnosis not present

## 2017-05-02 DIAGNOSIS — M9905 Segmental and somatic dysfunction of pelvic region: Secondary | ICD-10-CM | POA: Diagnosis not present

## 2017-05-02 DIAGNOSIS — M5416 Radiculopathy, lumbar region: Secondary | ICD-10-CM | POA: Diagnosis not present

## 2017-06-05 ENCOUNTER — Ambulatory Visit: Payer: BLUE CROSS/BLUE SHIELD | Admitting: Obstetrics and Gynecology

## 2017-06-05 ENCOUNTER — Encounter: Payer: Self-pay | Admitting: Obstetrics and Gynecology

## 2017-06-05 ENCOUNTER — Ambulatory Visit (INDEPENDENT_AMBULATORY_CARE_PROVIDER_SITE_OTHER): Payer: BLUE CROSS/BLUE SHIELD | Admitting: Obstetrics and Gynecology

## 2017-06-05 VITALS — BP 118/72 | HR 74 | Ht 66.5 in | Wt 173.0 lb

## 2017-06-05 DIAGNOSIS — Z0000 Encounter for general adult medical examination without abnormal findings: Secondary | ICD-10-CM

## 2017-06-05 DIAGNOSIS — Z01419 Encounter for gynecological examination (general) (routine) without abnormal findings: Secondary | ICD-10-CM

## 2017-06-06 DIAGNOSIS — M62830 Muscle spasm of back: Secondary | ICD-10-CM | POA: Diagnosis not present

## 2017-06-06 DIAGNOSIS — M5416 Radiculopathy, lumbar region: Secondary | ICD-10-CM | POA: Diagnosis not present

## 2017-06-06 DIAGNOSIS — M9905 Segmental and somatic dysfunction of pelvic region: Secondary | ICD-10-CM | POA: Diagnosis not present

## 2017-06-06 DIAGNOSIS — M9903 Segmental and somatic dysfunction of lumbar region: Secondary | ICD-10-CM | POA: Diagnosis not present

## 2017-06-10 DIAGNOSIS — L281 Prurigo nodularis: Secondary | ICD-10-CM | POA: Diagnosis not present

## 2017-06-10 DIAGNOSIS — L738 Other specified follicular disorders: Secondary | ICD-10-CM | POA: Diagnosis not present

## 2017-06-24 NOTE — Progress Notes (Signed)
Gynecology Annual Exam  PCP: Jearld Fenton, NP  Chief Complaint:  Chief Complaint  Patient presents with  . Gynecologic Exam    History of Present Illness: Patient is a 56 y.o. [id number] presents for annual exam. The patient has no complaints today.   LMP: Patient's last menstrual period was 04/29/2016. She is menopausal.  The patient is sexually active. She currently uses none for contraception. She denies dyspareunia.  The patient does not perform self breast exams.  There is notable family history of breast or ovarian cancer in her family.  The patient wears seatbelts: yes.   The patient has regular exercise: not asked.    The patient denies current symptoms of depression.    Review of Systems: Review of Systems  Constitutional: Negative for chills, fever, malaise/fatigue and weight loss.  HENT: Negative for congestion, hearing loss and sinus pain.   Eyes: Negative for blurred vision and double vision.  Respiratory: Negative for cough, sputum production, shortness of breath and wheezing.   Cardiovascular: Negative for chest pain, palpitations, orthopnea and leg swelling.  Gastrointestinal: Negative for abdominal pain, constipation, diarrhea, nausea and vomiting.  Genitourinary: Negative for dysuria, flank pain, frequency, hematuria and urgency.  Musculoskeletal: Negative for back pain, falls and joint pain.  Skin: Negative for itching and rash.  Neurological: Negative for dizziness and headaches.  Psychiatric/Behavioral: Negative for depression, substance abuse and suicidal ideas. The patient is not nervous/anxious.     Past Medical History:  Past Medical History:  Diagnosis Date  . Abnormal Pap smear of cervix 2002   had colpo and freezing and all neg since then  . Endometrial polyp   . History of mammogram 05/08/2011   cat 1  . History of Papanicolaou smear of cervix 03/14/2011   -/-  . Irregular bleeding   . Liver cyst     Past Surgical History:  Past  Surgical History:  Procedure Laterality Date  . CESAREAN SECTION    . COLPOSCOPY  2002  . CRYOTHERAPY  2002  . DILATION AND CURETTAGE OF UTERUS    . FOOT SURGERY     removal of bunion  . HAND SURGERY    . HERNIA REPAIR     umbilical  . HYSTEROSCOPY W/D&C N/A 12/21/2014   Procedure: DILATATION AND CURETTAGE /HYSTEROSCOPY;  Surgeon: Honor Loh Ward, MD;  Location: [facility];  Service: Gynecology;  Laterality: N/A;  . KNEE ARTHROSCOPY    . TUBAL LIGATION      Gynecologic History:  Patient's last menstrual period was 04/29/2016. Contraception: none Last Pap: Results were:  no abnormalities  Last mammogram: 2018 Results were: BI-RAD I Obstetric History: [id number]  Family History:  Family History  Problem Relation Age of Onset  . Breast cancer Maternal Grandmother   . Brain cancer Maternal Grandmother   . Breast cancer Paternal Grandmother 71  . Brain cancer Father 61       anaplastic astrocyntoma level 3    Social History:  Social History   Socioeconomic History  . Marital status: Married    Spouse name: Not on file  . Number of children: 3  . Years of education: Not on file  . Highest education level: Not on file  Social Needs  . Financial resource strain: Not on file  . Food insecurity - worry: Not on file  . Food insecurity - inability: Not on file  . Transportation needs - medical: Not on file  . Transportation needs - non-medical: Not on file  Occupational History  . Not on file  Tobacco Use  . Smoking status: Never Smoker  . Smokeless tobacco: Never Used  Substance and Sexual Activity  . Alcohol use: Yes    Comment: social  . Drug use: No  . Sexual activity: Yes    Birth control/protection: Surgical  Other Topics Concern  . Not on file  Social History Narrative  . Not on file    Allergies:  Allergies  Allergen Reactions  . Sulfa Antibiotics Rash  . Chocolate   . Citrus   . Peanut-Containing Drug Products Hives  . Tomato   . Ibuprofen Hives     Medications: Prior to Admission medications   Medication Sig Start Date End Date Taking? Authorizing Provider  Multiple Vitamin (MULTIVITAMIN) tablet Take 1 tablet by mouth daily.   Yes [provider]  Vitamin D, Cholecalciferol, 1000 units CAPS Take 1 capsule by mouth daily.   Yes [provider]  fluticasone (FLONASE) 50 MCG/ACT nasal spray Place into both nostrils daily as needed.     [provider]    Physical Exam Vitals: Blood pressure 118/72, pulse 74, height 5' 6.5" (1.689 m), weight 173 lb (78.5 kg), last menstrual period 04/29/2016.  Physical Exam  Constitutional: She is oriented to person, place, and time. She appears well-developed.  Genitourinary: Vagina normal and uterus normal. There is no lesion on the right labia. There is no lesion on the left labia. Vagina exhibits no lesion. Right adnexum does not display mass. Left adnexum does not display mass. Cervix does not exhibit motion tenderness.  HENT:  Head: Normocephalic and atraumatic.  Eyes: EOM are normal.  Neck: Neck supple. No thyromegaly present.  Cardiovascular: Normal rate, regular rhythm and normal heart sounds.  Pulmonary/Chest: Effort normal and breath sounds normal. Right breast exhibits no inverted nipple, no mass, no nipple discharge and no skin change. Left breast exhibits no inverted nipple, no mass, no nipple discharge and no skin change.  Abdominal: Soft. Bowel sounds are normal. She exhibits no distension and no mass.  Neurological: She is alert and oriented to person, place, and time.  Skin: Skin is warm and dry.  Psychiatric: She has a normal mood and affect. Her behavior is normal. Judgment and thought content normal.  Vitals reviewed.    Female chaperone present for pelvic and breast  portions of the physical exam  Assessment: 56 y.o. [id number] routine annual exam  Plan: Problem List Items Addressed This Visit    None      1) Mammogram - recommend yearly  screening mammogram.  Mammogram up to date.  2) STI screening was offered and declined  3) ASCCP guidelines and rational discussed.  Patient opts for every 3 years screening interval  4) Colonoscopy -- Screening recommended starting at age 85 for average risk individuals, age 44 for individuals deemed at increased risk (including African Americans) and recommended to continue until age 49.  For patient age 10-85 individualized approach is recommended.  Gold standard screening is via colonoscopy, Cologuard screening is an acceptable alternative for patient unwilling or unable to undergo colonoscopy.  "Colorectal cancer screening for average?risk adults: 2018 guideline update from the American Cancer Society"[place]: A Cancer Journal for Clinicians: Nov 21, 2016   5) Routine healthcare maintenance including cholesterol, diabetes screening discussed managed by PCP

## 2017-07-11 DIAGNOSIS — M62830 Muscle spasm of back: Secondary | ICD-10-CM | POA: Diagnosis not present

## 2017-07-11 DIAGNOSIS — M9903 Segmental and somatic dysfunction of lumbar region: Secondary | ICD-10-CM | POA: Diagnosis not present

## 2017-07-11 DIAGNOSIS — M9905 Segmental and somatic dysfunction of pelvic region: Secondary | ICD-10-CM | POA: Diagnosis not present

## 2017-07-11 DIAGNOSIS — M5416 Radiculopathy, lumbar region: Secondary | ICD-10-CM | POA: Diagnosis not present

## 2017-07-22 DIAGNOSIS — L738 Other specified follicular disorders: Secondary | ICD-10-CM | POA: Diagnosis not present

## 2017-08-08 DIAGNOSIS — M9903 Segmental and somatic dysfunction of lumbar region: Secondary | ICD-10-CM | POA: Diagnosis not present

## 2017-08-08 DIAGNOSIS — M5416 Radiculopathy, lumbar region: Secondary | ICD-10-CM | POA: Diagnosis not present

## 2017-08-08 DIAGNOSIS — M9905 Segmental and somatic dysfunction of pelvic region: Secondary | ICD-10-CM | POA: Diagnosis not present

## 2017-08-08 DIAGNOSIS — M62830 Muscle spasm of back: Secondary | ICD-10-CM | POA: Diagnosis not present

## 2017-09-12 DIAGNOSIS — M5416 Radiculopathy, lumbar region: Secondary | ICD-10-CM | POA: Diagnosis not present

## 2017-09-12 DIAGNOSIS — M9905 Segmental and somatic dysfunction of pelvic region: Secondary | ICD-10-CM | POA: Diagnosis not present

## 2017-09-12 DIAGNOSIS — M9903 Segmental and somatic dysfunction of lumbar region: Secondary | ICD-10-CM | POA: Diagnosis not present

## 2017-09-12 DIAGNOSIS — M62830 Muscle spasm of back: Secondary | ICD-10-CM | POA: Diagnosis not present

## 2017-10-17 DIAGNOSIS — M62830 Muscle spasm of back: Secondary | ICD-10-CM | POA: Diagnosis not present

## 2017-10-17 DIAGNOSIS — M9903 Segmental and somatic dysfunction of lumbar region: Secondary | ICD-10-CM | POA: Diagnosis not present

## 2017-10-17 DIAGNOSIS — M5416 Radiculopathy, lumbar region: Secondary | ICD-10-CM | POA: Diagnosis not present

## 2017-10-17 DIAGNOSIS — M9905 Segmental and somatic dysfunction of pelvic region: Secondary | ICD-10-CM | POA: Diagnosis not present

## 2017-10-29 DIAGNOSIS — L738 Other specified follicular disorders: Secondary | ICD-10-CM | POA: Diagnosis not present

## 2017-11-14 DIAGNOSIS — M9905 Segmental and somatic dysfunction of pelvic region: Secondary | ICD-10-CM | POA: Diagnosis not present

## 2017-11-14 DIAGNOSIS — M5416 Radiculopathy, lumbar region: Secondary | ICD-10-CM | POA: Diagnosis not present

## 2017-11-14 DIAGNOSIS — M62830 Muscle spasm of back: Secondary | ICD-10-CM | POA: Diagnosis not present

## 2017-11-14 DIAGNOSIS — M9903 Segmental and somatic dysfunction of lumbar region: Secondary | ICD-10-CM | POA: Diagnosis not present

## 2017-11-21 ENCOUNTER — Other Ambulatory Visit: Payer: Self-pay | Admitting: Family Medicine

## 2017-11-21 ENCOUNTER — Ambulatory Visit
Admission: RE | Admit: 2017-11-21 | Discharge: 2017-11-21 | Disposition: A | Discharge status: Home / Self Care | Payer: BLUE CROSS/BLUE SHIELD | Source: Ambulatory Visit | Attending: Family Medicine | Admitting: Family Medicine

## 2017-11-21 DIAGNOSIS — R0789 Other chest pain: Secondary | ICD-10-CM

## 2017-11-21 DIAGNOSIS — R05 Cough: Secondary | ICD-10-CM | POA: Diagnosis not present

## 2017-12-19 DIAGNOSIS — M62830 Muscle spasm of back: Secondary | ICD-10-CM | POA: Diagnosis not present

## 2017-12-19 DIAGNOSIS — M9905 Segmental and somatic dysfunction of pelvic region: Secondary | ICD-10-CM | POA: Diagnosis not present

## 2017-12-19 DIAGNOSIS — M5416 Radiculopathy, lumbar region: Secondary | ICD-10-CM | POA: Diagnosis not present

## 2017-12-19 DIAGNOSIS — M9903 Segmental and somatic dysfunction of lumbar region: Secondary | ICD-10-CM | POA: Diagnosis not present

## 2018-01-16 ENCOUNTER — Encounter: Payer: Self-pay | Admitting: Internal Medicine

## 2018-01-16 ENCOUNTER — Ambulatory Visit (INDEPENDENT_AMBULATORY_CARE_PROVIDER_SITE_OTHER): Payer: BLUE CROSS/BLUE SHIELD | Admitting: Internal Medicine

## 2018-01-16 ENCOUNTER — Encounter

## 2018-01-16 VITALS — BP 120/68 | HR 72 | Temp 98.1°F | Ht 66.5 in | Wt 175.4 lb

## 2018-01-16 DIAGNOSIS — K5900 Constipation, unspecified: Secondary | ICD-10-CM

## 2018-01-16 DIAGNOSIS — R079 Chest pain, unspecified: Secondary | ICD-10-CM

## 2018-01-16 DIAGNOSIS — R1084 Generalized abdominal pain: Secondary | ICD-10-CM

## 2018-01-16 DIAGNOSIS — Z1231 Encounter for screening mammogram for malignant neoplasm of breast: Secondary | ICD-10-CM | POA: Diagnosis not present

## 2018-01-16 DIAGNOSIS — K7689 Other specified diseases of liver: Secondary | ICD-10-CM

## 2018-01-16 NOTE — Patient Instructions (Addendum)
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 It is natural to also feel overwhelmed about the treatment ahead and what it will mean for your life. With care and support, you can manage this condition and recover from it. How to cope with anxiety Dealing with stress Stress is your body's reaction to life changes and events, both good and bad. Stress can last just a few hours or it can be ongoing. Stress can play a major role in anxiety, so it is important to learn both how to cope with stress and how to think about it differently. Talk with your health care provider or a counselor to learn more about stress reduction. He or she may suggest some stress reduction techniques, such as:  Music therapy. This can include creating or listening to music that you enjoy and that inspires you.  Mindfulness-based meditation. This involves being aware of your normal breaths, rather than trying to control your breathing. It can be done while sitting or walking.  Centering prayer. This is a kind of meditation that involves focusing on a word, phrase, or sacred image that is meaningful to you and that brings you peace.  Deep breathing. To do this, expand your stomach and inhale slowly through your nose. Hold your breath for 3-5 seconds. Then exhale slowly, allowing your stomach muscles to relax.  Self-talk. This is a skill where you identify thought patterns that lead to anxiety reactions and correct those thoughts.  Muscle relaxation. This involves tensing muscles then relaxing them.  Choose a stress reduction technique that fits your  lifestyle and personality. Stress reduction techniques take time and practice. Set aside 5-15 minutes a day to do them. Therapists can offer training in these techniques. The training may be covered by some insurance plans. Other things you can do to manage stress include:  Keeping a stress diary. This can help you learn what triggers your stress and ways to control your response.  Thinking about how you respond to certain situations. You may not be able to control everything, but you can control your reaction.  Making time for activities that help you relax, and not feeling guilty about spending your time in this way.  Therapy combined with coping and stress-reduction skills provides the best chance for successful treatment. Medicines Medicines can help ease symptoms. Medicines for anxiety include:  Anti-anxiety drugs.  Antidepressants.  Beta-blockers.  Medicines may be used as the main treatment for anxiety disorder, along with therapy, or if other treatments are not working. Medicines should be prescribed by a health care provider. Relationships Relationships can play a big part in helping you recover. Try to spend more time connecting with trusted friends and family members. Consider going to couples counseling, taking family education classes, or going to family therapy. Therapy can help you and others better understand the condition. How to recognize changes in your condition Everyone has a different response to treatment for anxiety. Recovery from anxiety happens when symptoms decrease and stop interfering with your daily activities at home or work. This may mean that you  will start to:  Have better concentration and focus.  Sleep better.  Be less irritable.  Have more energy.  Have improved memory.  It is important to recognize when your condition is getting worse. Contact your health care provider if your symptoms interfere with home or work and you do not feel like your  condition is improving. Where to find help and support: You can get help and support from these sources:  Self-help groups.  Online and [employer].  A trusted spiritual leader.  Couples counseling.  Family education classes.  Family therapy.  Follow these instructions at home:  Eat a healthy diet that includes plenty of vegetables, fruits, whole grains, low-fat dairy products, and lean protein. Do not eat a lot of foods that are high in solid fats, added sugars, or salt.  Exercise. Most adults should do the following: ? Exercise for at least 150 minutes each week. The exercise should increase your heart rate and make you sweat (moderate-intensity exercise). ? Strengthening exercises at least twice a week.  Cut down on caffeine, tobacco, alcohol, and other potentially harmful substances.  Get the right amount and quality of sleep. Most adults need 7-9 hours of sleep each night.  Make choices that simplify your life.  Take over-the-counter and prescription medicines only as told by your health care provider.  Avoid caffeine, alcohol, and certain over-the-counter cold medicines. These may make you feel worse. Ask your pharmacist which medicines to avoid.  Keep all follow-up visits as told by your health care provider. This is important. Questions to ask your health care provider  Would I benefit from therapy?  How often should I follow up with a health care provider?  How long do I need to take medicine?  Are there any long-term side effects of my medicine?  Are there any alternatives to taking medicine? Contact a health care provider if:  You have a hard time staying focused or finishing daily tasks.  You spend many hours a day feeling worried about everyday life.  You become exhausted by worry.  You start to have headaches, feel tense, or have nausea.  You urinate more than normal.  You have diarrhea. Get help right away if:  You have a racing  heart and shortness of breath.  You have thoughts of hurting yourself or others. If you ever feel like you may hurt yourself or others, or have thoughts about taking your own life, get help right away. You can go to your nearest emergency department or call:  Your local emergency services (911 in the [place]  A suicide crisis helpline, such as the [facility] at (847)689-7437. This is open 24-hours a day.  Summary  Taking steps to deal with stress can help calm you.  Medicines cannot cure anxiety disorders, but they can help ease symptoms.  Family, friends, and partners can play a big part in helping you recover from an anxiety disorder. This information is not intended to replace advice given to you by your health care provider. Make sure you discuss any questions you have with your health care provider. Document Released: 06/05/2016 Document Revised: 06/05/2016 Document Reviewed: 06/05/2016 Elsevier Interactive Patient Education  2018 [employer].   Tdap.DTaP Vaccine (Diphtheria, Tetanus, and Pertussis): What You Need to Know 1. Why get vaccinated? Diphtheria, tetanus, and pertussis are serious diseases caused by bacteria. Diphtheria and pertussis are spread from person to person. Tetanus enters the body through cuts or wounds. DIPHTHERIA causes a thick covering  in the back of the throat.  It can lead to breathing problems, paralysis, heart failure, and even death.  TETANUS (Lockjaw) causes painful tightening of the muscles, usually all over the body.  It can lead to "locking" of the jaw so the victim cannot open his mouth or swallow. Tetanus leads to death in up to 2 out of 10 cases.  PERTUSSIS (Whooping Cough) causes coughing spells so bad that it is hard for infants to eat, drink, or breathe. These spells can last for weeks.  It can lead to pneumonia, seizures (jerking and staring spells), brain damage, and death.  Diphtheria, tetanus, and pertussis  vaccine (DTaP) can help prevent these diseases. Most children who are vaccinated with DTaP will be protected throughout childhood. Many more children would get these diseases if we stopped vaccinating. DTaP is a safer version of an older vaccine called DTP. DTP is no longer used in the [place]. 2. Who should get DTaP vaccine and when? Children should get 5 doses of DTaP vaccine, one dose at each of the following ages:  2 months  4 months  6 months  15-18 months  4-6 years  DTaP may be given at the same time as other vaccines. 3. Some children should not get DTaP vaccine or should wait  Children with minor illnesses, such as a cold, may be vaccinated. But children who are moderately or severely ill should usually wait until they recover before getting DTaP vaccine.  Any child who had a life-threatening allergic reaction after a dose of DTaP should not get another dose.  Any child who suffered a brain or nervous system disease within 7 days after a dose of DTaP should not get another dose.  Talk with your doctor if your child: ? had a seizure or collapsed after a dose of DTaP, ? cried non-stop for 3 hours or more after a dose of DTaP, ? had a fever over 105F after a dose of DTaP. Ask your doctor for more information. Some of these children should not get another dose of pertussis vaccine, but may get a vaccine without pertussis, called DT. 4. Older children and adults DTaP is not licensed for adolescents, adults, or children 58 years of age and older. But older people still need protection. A vaccine called Tdap is similar to DTaP. A single dose of Tdap is recommended for people 11 through 57 years of age. Another vaccine, called Td, protects against tetanus and diphtheria, but not pertussis. It is recommended every 10 years. There are separate Vaccine Information Statements for these vaccines. 5. What are the risks from DTaP vaccine? Getting diphtheria, tetanus, or pertussis  disease is much riskier than getting DTaP vaccine. However, a vaccine, like any medicine, is capable of causing serious problems, such as severe allergic reactions. The risk of DTaP vaccine causing serious harm, or death, is extremely small. Mild problems (common)  Fever (up to about 1 child in 4)  Redness or swelling where the shot was given (up to about 1 child in 4)  Soreness or tenderness where the shot was given (up to about 1 child in 4) These problems occur more often after the 4th and 5th doses of the DTaP series than after earlier doses. Sometimes the 4th or 5th dose of DTaP vaccine is followed by swelling of the entire arm or leg in which the shot was given, lasting 1-7 days (up to about 1 child in 65). Other mild problems include:  Fussiness (up to about  1 child in 3)  Tiredness or poor appetite (up to about 1 child in 10)  Vomiting (up to about 1 child in 40) These problems generally occur 1-3 days after the shot. Moderate problems (uncommon)  Seizure (jerking or staring) (about 1 child out of 14,000)  Non-stop crying, for 3 hours or more (up to about 1 child out of 1,000)  High fever, over 105F (about 1 child out of 16,000) Severe problems (very rare)  Serious allergic reaction (less than 1 out of a million doses)  Several other severe problems have been reported after DTaP vaccine. These include: ? Long-term seizures, coma, or lowered consciousness ? Permanent brain damage. These are so rare it is hard to tell if they are caused by the vaccine. Controlling fever is especially important for children who have had seizures, for any reason. It is also important if another family member has had seizures. You can reduce fever and pain by giving your child an aspirin-free pain reliever when the shot is given, and for the next 24 hours, following the package instructions. 6. What if there is a serious reaction? What should I look for? Look for anything that concerns you,  such as signs of a severe allergic reaction, very high fever, or behavior changes. Signs of a severe allergic reaction can include hives, swelling of the face and throat, difficulty breathing, a fast heartbeat, dizziness, and weakness. These would start a few minutes to a few hours after the vaccination. What should I do?  If you think it is a severe allergic reaction or other emergency that can't wait, call 9-1-1 or get the person to the nearest hospital. Otherwise, call your doctor.  Afterward, the reaction should be reported to the Vaccine Adverse Event Reporting System (VAERS). Your doctor might file this report, or you can do it yourself through the VAERS web site at www.vaers.SamedayNews.es, or by calling (956)629-6638. ? VAERS is only for reporting reactions. They do not give medical advice. 7. The National Vaccine Injury Compensation Program The [employer] Vaccine Injury Compensation Program (VICP) is a federal program that was created to compensate people who may have been injured by certain vaccines. Persons who believe they may have been injured by a vaccine can learn about the program and about filing a claim by calling 516-728-5333 or visiting the [facility] website at GoldCloset.com.ee. 8. How can I learn more?  Ask your doctor.  Call your local or state health department.  Contact the Centers for Disease Control and Prevention (CDC): ? Call (330)042-3186 (1-800-CDC-INFO) or ? Visit CDC's website at http://hunter.com/ CDC DTaP Vaccine (Diphtheria, Tetanus, and Pertussis) VIS (11/08/05) This information is not intended to replace advice given to you by your health care provider. Make sure you discuss any questions you have with your health care provider. Document Released: 04/08/2006 Document Revised: 03/01/2016 Document Reviewed: 03/01/2016 Elsevier Interactive Patient Education  2017 [employer].    Constipation, Adult Constipation is when a person has fewer bowel  movements in a week than normal, has difficulty having a bowel movement, or has stools that are dry, hard, or larger than normal. Constipation may be caused by an underlying condition. It may become worse with age if a person takes certain medicines and does not take in enough fluids. Follow these instructions at home: Eating and drinking   Eat foods that have a lot of fiber, such as fresh fruits and vegetables, whole grains, and beans.  Limit foods that are high in fat, low in fiber,  or overly processed, such as french fries, hamburgers, cookies, candies, and soda.  Drink enough fluid to keep your urine clear or pale yellow. General instructions  Exercise regularly or as told by your health care provider.  Go to the restroom when you have the urge to go. Do not hold it in.  Take over-the-counter and prescription medicines only as told by your health care provider. These include any fiber supplements.  Practice pelvic floor retraining exercises, such as deep breathing while relaxing the lower abdomen and pelvic floor relaxation during bowel movements.  Watch your condition for any changes.  Keep all follow-up visits as told by your health care provider. This is important. Contact a health care provider if:  You have pain that gets worse.  You have a fever.  You do not have a bowel movement after 4 days.  You vomit.  You are not hungry.  You lose weight.  You are bleeding from the anus.  You have thin, pencil-like stools. Get help right away if:  You have a fever and your symptoms suddenly get worse.  You leak stool or have blood in your stool.  Your abdomen is bloated.  You have severe pain in your abdomen.  You feel dizzy or you faint. This information is not intended to replace advice given to you by your health care provider. Make sure you discuss any questions you have with your health care provider. Document Released: 03/09/2004 Document Revised: 12/30/2015  Document Reviewed: 11/30/2015 Elsevier Interactive Patient Education  2018 [employer].   Menopause Menopause is the normal time of life when menstrual periods stop completely. Menopause is complete when you have missed 12 consecutive menstrual periods. It usually occurs between the ages of 75 years and 22 years. Very rarely does a woman develop menopause before the age of 12 years. At menopause, your ovaries stop producing the female hormones estrogen and progesterone. This can cause undesirable symptoms and also affect your health. Sometimes the symptoms may occur 4-5 years before the menopause begins. There is no relationship between menopause and:  Oral contraceptives.  Number of children you had.  Race.  The age your menstrual periods started (menarche).  Heavy smokers and very thin women may develop menopause earlier in life. What are the causes?  The ovaries stop producing the female hormones estrogen and progesterone. Other causes include:  Surgery to remove both ovaries.  The ovaries stop functioning for no known reason.  Tumors of the pituitary gland in the brain.  Medical disease that affects the ovaries and hormone production.  Radiation treatment to the abdomen or pelvis.  Chemotherapy that affects the ovaries.  What are the signs or symptoms?  Hot flashes.  Night sweats.  Decrease in sex drive.  Vaginal dryness and thinning of the vagina causing painful intercourse.  Dryness of the skin and developing wrinkles.  Headaches.  Tiredness.  Irritability.  Memory problems.  Weight gain.  Bladder infections.  Hair growth of the face and chest.  Infertility. More serious symptoms include:  Loss of bone (osteoporosis) causing breaks (fractures).  Depression.  Hardening and narrowing of the arteries (atherosclerosis) causing heart attacks and strokes.  How is this diagnosed?  When the menstrual periods have stopped for 12 straight  months.  Physical exam.  Hormone studies of the blood. How is this treated? There are many treatment choices and nearly as many questions about them. The decisions to treat or not to treat menopausal changes is an individual choice made with  your health care provider. Your health care provider can discuss the treatments with you. Together, you can decide which treatment will work best for you. Your treatment choices may include:  Hormone therapy (estrogen and progesterone).  Non-hormonal medicines.  Treating the individual symptoms with medicine (for example antidepressants for depression).  Herbal medicines that may help specific symptoms.  Counseling by a psychiatrist or psychologist.  Group therapy.  Lifestyle changes including: ? Eating healthy. ? Regular exercise. ? Limiting caffeine and alcohol. ? Stress management and meditation.  No treatment.  Follow these instructions at home:  Take the medicine your health care provider gives you as directed.  Get plenty of sleep and rest.  Exercise regularly.  Eat a diet that contains calcium (good for the bones) and soy products (acts like estrogen hormone).  Avoid alcoholic beverages.  Do not smoke.  If you have hot flashes, dress in layers.  Take supplements, calcium, and vitamin D to strengthen bones.  You can use over-the-counter lubricants or moisturizers for vaginal dryness.  Group therapy is sometimes very helpful.  Acupuncture may be helpful in some cases. Contact a health care provider if:  You are not sure you are in menopause.  You are having menopausal symptoms and need advice and treatment.  You are still having menstrual periods after age 62 years.  You have pain with intercourse.  Menopause is complete (no menstrual period for 12 months) and you develop vaginal bleeding.  You need a referral to a specialist (gynecologist, psychiatrist, or psychologist) for treatment. Get help right away  if:  You have severe depression.  You have excessive vaginal bleeding.  You fell and think you have a broken bone.  You have pain when you urinate.  You develop leg or chest pain.  You have a fast pounding heart beat (palpitations).  You have severe headaches.  You develop vision problems.  You feel a lump in your breast.  You have abdominal pain or severe indigestion. This information is not intended to replace advice given to you by your health care provider. Make sure you discuss any questions you have with your health care provider. Document Released: 09/01/2003 Document Revised: 11/17/2015 Document Reviewed: 01/08/2013 Elsevier Interactive Patient Education  2017 [employer].

## 2018-01-16 NOTE — Progress Notes (Signed)
Pre visit review using our clinic review tool, if applicable. No additional management support is needed unless otherwise documented below in the visit note. 

## 2018-01-17 ENCOUNTER — Encounter: Payer: Self-pay | Admitting: Internal Medicine

## 2018-01-17 DIAGNOSIS — R1084 Generalized abdominal pain: Secondary | ICD-10-CM | POA: Insufficient documentation

## 2018-01-17 DIAGNOSIS — K5909 Other constipation: Secondary | ICD-10-CM | POA: Insufficient documentation

## 2018-01-17 DIAGNOSIS — K5900 Constipation, unspecified: Secondary | ICD-10-CM | POA: Insufficient documentation

## 2018-01-17 NOTE — Progress Notes (Signed)
Chief Complaint  Patient presents with  . New Patient (Initial Visit)   New patient  1. C/o LLQ ab pain intermittently and h/o constipation nothing tried had CT 2017 with+ liver cysts and left kidney stone never had colonoscopy  2. C/o occasional chest tightness no h/o GERD but does have some anxiety and considering seeing therapy related to work to help. Declines cardiac referral for now. CP is b/l sides and below left breast at rest and with exertion CXR neg 11/21/17 x 1-1.5 years 3. C/o no cycle x 1.5 years and wt gain x 1 year lmp 04/2016   Review of Systems  Constitutional: Negative for weight loss.  HENT: Negative for hearing loss.   Eyes: Negative for blurred vision.  Respiratory: Negative for shortness of breath.   Cardiovascular: Positive for chest pain.  Gastrointestinal: Positive for abdominal pain.  Musculoskeletal: Negative for falls.  Skin: Negative for rash.  Neurological: Negative for headaches.  Psychiatric/Behavioral: Negative for depression.   Past Medical History:  Diagnosis Date  . Abnormal Pap smear of cervix 2002   had colpo and freezing and all neg since then  . Endometrial polyp   . History of chicken pox   . History of mammogram 05/08/2011   cat 1  . History of Papanicolaou smear of cervix 03/14/2011   -/-  . Irregular bleeding   . Liver cyst    Past Surgical History:  Procedure Laterality Date  . CERVICAL POLYPECTOMY     2016  . CESAREAN SECTION     1996  . COLPOSCOPY  2002   nl paps since   . CRYOTHERAPY  2002  . DILATION AND CURETTAGE OF UTERUS     1999 2/2 miscarriage   . FOOT SURGERY     removal of bunion 2011  . HAND SURGERY     1968, 1981  . HERNIA REPAIR     umbilical [id number]/[id number]  . HYSTEROSCOPY W/D&C N/A 12/21/2014   Procedure: DILATATION AND CURETTAGE /HYSTEROSCOPY;  Surgeon: Honor Loh Ward, MD;  Location: [facility] ORS;  Service: Gynecology;  Laterality: N/A;  . KNEE ARTHROSCOPY     ACL tear 1984  . TUBAL LIGATION     2005   Family  History  Problem Relation Age of Onset  . Breast cancer Maternal Grandmother   . Brain cancer Maternal Grandmother   . Cancer Maternal Grandmother        breast and brain   . Breast cancer Paternal Grandmother 46  . Cancer Paternal Grandmother        breast   . Hypertension Mother   . Other Mother        liver cysts  . Brain cancer Father 84       anaplastic astrocyntoma level 3  . Cancer Father        brain astrocytoma   . Depression Sister   . Diabetes Brother    Social History   Socioeconomic History  . Marital status: Married    Spouse name: Not on file  . Number of children: 3  . Years of education: Not on file  . Highest education level: Not on file  Occupational History  . Not on file  Social Needs  . Financial resource strain: Not on file  . Food insecurity:    Worry: Not on file    Inability: Not on file  . Transportation needs:    Medical: Not on file    Non-medical: Not on file  Tobacco Use  .  Smoking status: Never Smoker  . Smokeless tobacco: Never Used  Substance and Sexual Activity  . Alcohol use: Yes    Comment: social  . Drug use: No  . Sexual activity: Yes    Birth control/protection: Surgical  Lifestyle  . Physical activity:    Days per week: 1 day    Minutes per session: 30 min  . Stress: To some extent  Relationships  . Social connections:    Talks on phone: More than three times a week    Gets together: Never    Attends religious service: Never    Active member of club or organization: No    Attends meetings of clubs or organizations: Never    Relationship status: Married  . Intimate partner violence:    Fear of current or ex partner: No    Emotionally abused: No    Physically abused: No    Forced sexual activity: No  Other Topics Concern  . Not on file  Social History Narrative   [place]    Married 3 kids, married 32 years as of 12/2017    Never smoker    No guns, safe in relationship, wears seat belt     Current Meds  Medication Sig  . Clindamycin Phosphate foam Apply topically 2 (two) times daily.  Marland Kitchen doxycycline (ADOXA) 150 MG tablet Take 150 mg by mouth 2 (two) times daily.  . fluticasone (FLONASE) 50 MCG/ACT nasal spray Place into both nostrils daily as needed.   . Multiple Vitamin (MULTIVITAMIN) tablet Take 1 tablet by mouth daily.  . Vitamin D, Cholecalciferol, 1000 units CAPS Take 1 capsule by mouth daily.   Allergies  Allergen Reactions  . Sulfa Antibiotics Rash  . Chocolate   . Citrus   . Peanut-Containing Drug Products Hives  . Tomato   . Ibuprofen Hives   No results found for this or any previous visit (from the past 2160 hour(s)). Objective  Body mass index is 27.89 kg/m. Wt Readings from Last 3 Encounters:  01/16/18 175 lb 6.4 oz (79.6 kg)  06/05/17 173 lb (78.5 kg)  12/12/16 170 lb (77.1 kg)   Temp Readings from Last 3 Encounters:  01/16/18 98.1 F (36.7 C) (Oral)  04/12/16 98 F (36.7 C) (Oral)  12/21/14 97.4 F (36.3 C) (Oral)   BP Readings from Last 3 Encounters:  01/16/18 120/68  06/05/17 118/72  12/12/16 126/80   Pulse Readings from Last 3 Encounters:  01/16/18 72  06/05/17 74  12/12/16 76    Physical Exam  Constitutional: She is oriented to person, place, and time. Vital signs are normal. She appears well-developed and well-nourished. She is cooperative.  HENT:  Head: Normocephalic and atraumatic.  Mouth/Throat: Oropharynx is clear and moist and mucous membranes are normal.  Eyes: Pupils are equal, round, and reactive to light. Conjunctivae are normal.  Cardiovascular: Normal rate, regular rhythm and normal heart sounds.  Pulmonary/Chest: Effort normal and breath sounds normal.  Neurological: She is alert and oriented to person, place, and time. Gait normal.  Skin: Skin is warm, dry and intact.  Psychiatric: She has a normal mood and affect. Her speech is normal and behavior is normal. Judgment and thought content normal. Cognition and  memory are normal.  Nursing note and vitals reviewed.   Assessment   1. Nonspecific chest pain denies GERD sxs maybe related to anxiety consider r/o cardiology pt declines referral at this time   2.generalized abdominal pain LLQ worse intermittent, h/o constipation  3.  Liver cysts likely related to genetic pt to ask mom if been genetically tested for CF vs PCKD   4. HM Plan   1. CXR neg 11/21/17  Declines cardiology for now  Consider therapy for anxiety hold medication for now  2.  Declines colonoscopy will do [place] ab if neg CT ab/pelvis with contrast to w/u and consider GI referral  Disc otc meds constipation, increase fiber  3.  Will eval with imaging [place] and consider CT in future  4.  Last flu shot 2017  Tdap ? Last consider in future  Will disc shingrix in future  Declines STD testing   Pap 2017 [facility] likely need another 2020 saw Dr. Vikki Ports Ward  -requested records [facility] bx 2016 negative  mammo neg 01/01/17 neg referred today  Declines colonoscopy, consider cologuard check price with insurance  Dermatology Dr. 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 Olivia Mackie McLean-Scocuzza-[facility]

## 2018-01-20 NOTE — Progress Notes (Signed)
Noted no Tdap in [place]

## 2018-01-20 NOTE — Progress Notes (Signed)
Patient is not in [facility] 

## 2018-01-20 NOTE — Progress Notes (Signed)
Noted vaccines not in [facility]

## 2018-01-23 DIAGNOSIS — M62830 Muscle spasm of back: Secondary | ICD-10-CM | POA: Diagnosis not present

## 2018-01-23 DIAGNOSIS — M9905 Segmental and somatic dysfunction of pelvic region: Secondary | ICD-10-CM | POA: Diagnosis not present

## 2018-01-23 DIAGNOSIS — M5416 Radiculopathy, lumbar region: Secondary | ICD-10-CM | POA: Diagnosis not present

## 2018-01-23 DIAGNOSIS — M9903 Segmental and somatic dysfunction of lumbar region: Secondary | ICD-10-CM | POA: Diagnosis not present

## 2018-01-30 ENCOUNTER — Other Ambulatory Visit: Payer: Self-pay | Admitting: Internal Medicine

## 2018-01-30 ENCOUNTER — Telehealth: Payer: Self-pay

## 2018-01-30 DIAGNOSIS — R1032 Left lower quadrant pain: Secondary | ICD-10-CM

## 2018-01-30 NOTE — Telephone Encounter (Signed)
Copied from [place] [id number]. Topic: General - Other >> Jan 30, 2018  8:47 AM Lennox Solders wrote: Reason for VEX:[id number] from [facility] has a question concerning abd ultrasound. Pt is schedule for tomorrow 01-31-18

## 2018-01-31 ENCOUNTER — Ambulatory Visit: Admission: RE | Admit: 2018-01-31 | Payer: BLUE CROSS/BLUE SHIELD | Source: Ambulatory Visit

## 2018-02-04 ENCOUNTER — Telehealth: Payer: Self-pay

## 2018-02-04 NOTE — Telephone Encounter (Signed)
Copied from [place] [phone]. Topic: Referral - Status >> Feb 04, 2018  9:56 AM Scherrie Gerlach wrote: Reason for CRM: peggy with [facility] scheduling calling to advise the order for the  US transvaginal non OB has not been signed.  (The 2 other orders have been signed.) Please advise

## 2018-02-07 ENCOUNTER — Ambulatory Visit
Admission: RE | Admit: 2018-02-07 | Discharge: 2018-02-07 | Disposition: A | Discharge status: Home / Self Care | Payer: BLUE CROSS/BLUE SHIELD | Source: Ambulatory Visit | Attending: Internal Medicine | Admitting: Internal Medicine

## 2018-02-07 DIAGNOSIS — R1084 Generalized abdominal pain: Secondary | ICD-10-CM | POA: Diagnosis not present

## 2018-02-07 DIAGNOSIS — K7689 Other specified diseases of liver: Secondary | ICD-10-CM | POA: Diagnosis not present

## 2018-02-07 DIAGNOSIS — N200 Calculus of kidney: Secondary | ICD-10-CM | POA: Insufficient documentation

## 2018-02-07 DIAGNOSIS — R1032 Left lower quadrant pain: Secondary | ICD-10-CM | POA: Diagnosis not present

## 2018-02-13 ENCOUNTER — Ambulatory Visit
Admission: RE | Admit: 2018-02-13 | Discharge: 2018-02-13 | Disposition: A | Discharge status: Home / Self Care | Payer: BLUE CROSS/BLUE SHIELD | Source: Ambulatory Visit | Attending: Internal Medicine | Admitting: Internal Medicine

## 2018-02-13 DIAGNOSIS — Z1231 Encounter for screening mammogram for malignant neoplasm of breast: Secondary | ICD-10-CM

## 2018-02-27 DIAGNOSIS — M5416 Radiculopathy, lumbar region: Secondary | ICD-10-CM | POA: Diagnosis not present

## 2018-02-27 DIAGNOSIS — M9903 Segmental and somatic dysfunction of lumbar region: Secondary | ICD-10-CM | POA: Diagnosis not present

## 2018-02-27 DIAGNOSIS — M62830 Muscle spasm of back: Secondary | ICD-10-CM | POA: Diagnosis not present

## 2018-02-27 DIAGNOSIS — M9905 Segmental and somatic dysfunction of pelvic region: Secondary | ICD-10-CM | POA: Diagnosis not present

## 2018-04-03 DIAGNOSIS — M5416 Radiculopathy, lumbar region: Secondary | ICD-10-CM | POA: Diagnosis not present

## 2018-04-03 DIAGNOSIS — M9905 Segmental and somatic dysfunction of pelvic region: Secondary | ICD-10-CM | POA: Diagnosis not present

## 2018-04-03 DIAGNOSIS — M62830 Muscle spasm of back: Secondary | ICD-10-CM | POA: Diagnosis not present

## 2018-04-03 DIAGNOSIS — M9903 Segmental and somatic dysfunction of lumbar region: Secondary | ICD-10-CM | POA: Diagnosis not present

## 2018-04-18 ENCOUNTER — Ambulatory Visit (INDEPENDENT_AMBULATORY_CARE_PROVIDER_SITE_OTHER): Payer: BLUE CROSS/BLUE SHIELD | Admitting: Internal Medicine

## 2018-04-18 ENCOUNTER — Encounter: Payer: Self-pay | Admitting: Internal Medicine

## 2018-04-18 VITALS — BP 130/72 | HR 65 | Temp 98.5°F | Ht 66.5 in | Wt 178.8 lb

## 2018-04-18 DIAGNOSIS — K5900 Constipation, unspecified: Secondary | ICD-10-CM | POA: Diagnosis not present

## 2018-04-18 DIAGNOSIS — R1084 Generalized abdominal pain: Secondary | ICD-10-CM | POA: Diagnosis not present

## 2018-04-18 DIAGNOSIS — R079 Chest pain, unspecified: Secondary | ICD-10-CM

## 2018-04-18 DIAGNOSIS — K7689 Other specified diseases of liver: Secondary | ICD-10-CM | POA: Diagnosis not present

## 2018-04-18 NOTE — Progress Notes (Signed)
Chief Complaint  Patient presents with  . Follow-up   F/u 1. Still c/o L ab pain intermittently [place] negative c/w liver cysts kidney stone left and has constipation has stool every 2-3 days which is chronic for her  FH 1 brother with polyps and another who had surgery for mass removed from colon 2. Still c/o chest pain with tightness some days 4/10 and other 7-8/10   Review of Systems  Constitutional: Negative for weight loss.  Respiratory: Negative for shortness of breath.   Cardiovascular: Positive for chest pain.  Gastrointestinal: Positive for abdominal pain and constipation. Negative for nausea and vomiting.  Psychiatric/Behavioral: The patient is nervous/anxious.    Past Medical History:  Diagnosis Date  . Abnormal Pap smear of cervix 2002   had colpo and freezing and all neg since then  . Endometrial polyp   . Folliculitis    buttocks Dr. Nicole Kindred  . History of chicken pox   . History of mammogram 05/08/2011   cat 1  . History of Papanicolaou smear of cervix 03/14/2011   -/-  . Irregular bleeding   . Kidney stone on left side   . Liver cyst    Past Surgical History:  Procedure Laterality Date  . CERVICAL POLYPECTOMY     2016  . CESAREAN SECTION     1996  . COLPOSCOPY  2002   nl paps since   . CRYOTHERAPY  2002  . DILATION AND CURETTAGE OF UTERUS     1999 2/2 miscarriage   . FOOT SURGERY     removal of bunion 2011  . HAND SURGERY     1968, 1981  . HERNIA REPAIR     umbilical [id number]  . HYSTEROSCOPY W/D&C N/A 12/21/2014   Procedure: DILATATION AND CURETTAGE /HYSTEROSCOPY;  Surgeon: Honor Loh Ward, MD;  Location: [facility] ORS;  Service: Gynecology;  Laterality: N/A;  . KNEE ARTHROSCOPY     ACL tear 1984  . TUBAL LIGATION     2005   Family History  Problem Relation Age of Onset  . Breast cancer Maternal Grandmother   . Brain cancer Maternal Grandmother   . Cancer Maternal Grandmother        breast and brain   . Breast cancer Paternal Grandmother 68  .  Cancer Paternal Grandmother        breast   . Hypertension Mother   . Other Mother        liver cysts  . Brain cancer Father 79       anaplastic astrocyntoma level 3  . Cancer Father        brain astrocytoma   . Depression Sister   . Diabetes Brother    Social History   Socioeconomic History  . Marital status: Married    Spouse name: Not on file  . Number of children: 3  . Years of education: Not on file  . Highest education level: Not on file  Occupational History  . Not on file  Social Needs  . Financial resource strain: Not on file  . Food insecurity:    Worry: Not on file    Inability: Not on file  . Transportation needs:    Medical: Not on file    Non-medical: Not on file  Tobacco Use  . Smoking status: Never Smoker  . Smokeless tobacco: Never Used  Substance and Sexual Activity  . Alcohol use: Yes    Comment: social  . Drug use: No  . Sexual activity: Yes  Birth control/protection: Surgical  Lifestyle  . Physical activity:    Days per week: 1 day    Minutes per session: 30 min  . Stress: To some extent  Relationships  . Social connections:    Talks on phone: More than three times a week    Gets together: Never    Attends religious service: Never    Active member of club or organization: No    Attends meetings of clubs or organizations: Never    Relationship status: Married  . Intimate partner violence:    Fear of current or ex partner: No    Emotionally abused: No    Physically abused: No    Forced sexual activity: No  Other Topics Concern  . Not on file  Social History Narrative   [place]    Married 3 kids, married 32 years as of 12/2017    Never smoker    No guns, safe in relationship, wears seat belt    Current Meds  Medication Sig  . Clindamycin Phosphate foam Apply topically 2 (two) times daily.  Marland Kitchen doxycycline (ADOXA) 50 MG tablet Take 50 mg by mouth 2 (two) times a week.   . Multiple Vitamin (MULTIVITAMIN) tablet  Take 1 tablet by mouth daily.  . Vitamin D, Cholecalciferol, 1000 units CAPS Take 1 capsule by mouth daily.   Allergies  Allergen Reactions  . Sulfa Antibiotics Rash  . Chocolate   . Citrus   . Peanut-Containing Drug Products Hives  . Tomato   . Ibuprofen Hives   No results found for this or any previous visit (from the past 2160 hour(s)). Objective  Body mass index is 28.43 kg/m. Wt Readings from Last 3 Encounters:  04/18/18 178 lb 12.8 oz (81.1 kg)  01/16/18 175 lb 6.4 oz (79.6 kg)  06/05/17 173 lb (78.5 kg)   Temp Readings from Last 3 Encounters:  04/18/18 98.5 F (36.9 C) (Oral)  01/16/18 98.1 F (36.7 C) (Oral)  04/12/16 98 F (36.7 C) (Oral)   BP Readings from Last 3 Encounters:  04/18/18 130/72  01/16/18 120/68  06/05/17 118/72   Pulse Readings from Last 3 Encounters:  04/18/18 65  01/16/18 72  06/05/17 74    Physical Exam  Constitutional: She is oriented to person, place, and time. Vital signs are normal. She appears well-developed and well-nourished. She is cooperative.  HENT:  Head: Normocephalic and atraumatic.  Mouth/Throat: Oropharynx is clear and moist and mucous membranes are normal.  Eyes: Pupils are equal, round, and reactive to light. Conjunctivae are normal.  Cardiovascular: Normal rate, regular rhythm and normal heart sounds.  Pulmonary/Chest: Effort normal and breath sounds normal. She exhibits tenderness.  Abdominal: Soft. Bowel sounds are normal. There is tenderness. There is no guarding.  LUQ ab pain and LMQ ab pain   Neurological: She is alert and oriented to person, place, and time. Gait normal.  Skin: Skin is warm, dry and intact.  Psychiatric: She has a normal mood and affect. Her speech is normal and behavior is normal. Judgment and thought content normal. Cognition and memory are normal.  Nursing note and vitals reviewed.   Assessment   1. Chest pain likely noncardiac anxiety, costochondritis, constipation less likely GERD it is  reproducible  2. Ab pain left [place] negative except for kidney stone 1 cm and liver cysts  3. HM Plan   1. Monitor  Can try otc nsaids, tylenol  Cards if worse  2. rec colonoscopy GI referral 1st before  CT ab/pelvis  3.  B/w sch at work 06/10/18   Last flu shot 2017, declines today  Tdap ? Last consider in future will get at work  Will disc shingrix in future  Declines STD testing   Pap 2017 [facility] likely need another 2020 saw Dr. Vikki Ports Ward  -requested records [facility] bx 2016 negative  -due 01/06/2019   mammo neg 02/13/18 neg Declines colonoscopy, consider cologuard check price with insurance  -pt will let me know will call back about colonoscopy h/o 1 brother with polyps and another with colon resection for mass   Dermatology Dr. 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 Olivia Mackie McLean-Scocuzza-[facility] Medicine

## 2018-04-18 NOTE — Patient Instructions (Addendum)
Warm prune juice, chia or flaxseeds, Oatmeal steel cuts   Figure out more history about brother please and let me about colonoscopy   Tdap vaccine q10 years  Due for pap 01/06/2019 [facility]  Bonine or Dramamine motion sickness chewable tablets    Tdap/DTaP Vaccine (Diphtheria, Tetanus, and Pertussis): What You Need to Know 1. Why get vaccinated? Diphtheria, tetanus, and pertussis are serious diseases caused by bacteria. Diphtheria and pertussis are spread from person to person. Tetanus enters the body through cuts or wounds. DIPHTHERIA causes a thick covering in the back of the throat.  It can lead to breathing problems, paralysis, heart failure, and even death.  TETANUS (Lockjaw) causes painful tightening of the muscles, usually all over the body.  It can lead to "locking" of the jaw so the victim cannot open his mouth or swallow. Tetanus leads to death in up to 2 out of 10 cases.  PERTUSSIS (Whooping Cough) causes coughing spells so bad that it is hard for infants to eat, drink, or breathe. These spells can last for weeks.  It can lead to pneumonia, seizures (jerking and staring spells), brain damage, and death.  Diphtheria, tetanus, and pertussis vaccine (DTaP) can help prevent these diseases. Most children who are vaccinated with DTaP will be protected throughout childhood. Many more children would get these diseases if we stopped vaccinating. DTaP is a safer version of an older vaccine called DTP. DTP is no longer used in the [place]. 2. Who should get DTaP vaccine and when? Children should get 5 doses of DTaP vaccine, one dose at each of the following ages:  2 months  4 months  6 months  15-18 months  4-6 years  DTaP may be given at the same time as other vaccines. 3. Some children should not get DTaP vaccine or should wait  Children with minor illnesses, such as a cold, may be vaccinated. But children who are moderately or severely ill should usually wait until  they recover before getting DTaP vaccine.  Any child who had a life-threatening allergic reaction after a dose of DTaP should not get another dose.  Any child who suffered a brain or nervous system disease within 7 days after a dose of DTaP should not get another dose.  Talk with your doctor if your child: ? had a seizure or collapsed after a dose of DTaP, ? cried non-stop for 3 hours or more after a dose of DTaP, ? had a fever over 105F after a dose of DTaP. Ask your doctor for more information. Some of these children should not get another dose of pertussis vaccine, but may get a vaccine without pertussis, called DT. 4. Older children and adults DTaP is not licensed for adolescents, adults, or children 20 years of age and older. But older people still need protection. A vaccine called Tdap is similar to DTaP. A single dose of Tdap is recommended for people 11 through 57 years of age. Another vaccine, called Td, protects against tetanus and diphtheria, but not pertussis. It is recommended every 10 years. There are separate Vaccine Information Statements for these vaccines. 5. What are the risks from DTaP vaccine? Getting diphtheria, tetanus, or pertussis disease is much riskier than getting DTaP vaccine. However, a vaccine, like any medicine, is capable of causing serious problems, such as severe allergic reactions. The risk of DTaP vaccine causing serious harm, or death, is extremely small. Mild problems (common)  Fever (up to about 1 child in 4)  Redness or  swelling where the shot was given (up to about 1 child in 4)  Soreness or tenderness where the shot was given (up to about 1 child in 4) These problems occur more often after the 4th and 5th doses of the DTaP series than after earlier doses. Sometimes the 4th or 5th dose of DTaP vaccine is followed by swelling of the entire arm or leg in which the shot was given, lasting 1-7 days (up to about 1 child in 24). Other mild problems  include:  Fussiness (up to about 1 child in 3)  Tiredness or poor appetite (up to about 1 child in 10)  Vomiting (up to about 1 child in 23) These problems generally occur 1-3 days after the shot. Moderate problems (uncommon)  Seizure (jerking or staring) (about 1 child out of 14,000)  Non-stop crying, for 3 hours or more (up to about 1 child out of 1,000)  High fever, over 105F (about 1 child out of 16,000) Severe problems (very rare)  Serious allergic reaction (less than 1 out of a million doses)  Several other severe problems have been reported after DTaP vaccine. These include: ? Long-term seizures, coma, or lowered consciousness ? Permanent brain damage. These are so rare it is hard to tell if they are caused by the vaccine. Controlling fever is especially important for children who have had seizures, for any reason. It is also important if another family member has had seizures. You can reduce fever and pain by giving your child an aspirin-free pain reliever when the shot is given, and for the next 24 hours, following the package instructions. 6. What if there is a serious reaction? What should I look for? Look for anything that concerns you, such as signs of a severe allergic reaction, very high fever, or behavior changes. Signs of a severe allergic reaction can include hives, swelling of the face and throat, difficulty breathing, a fast heartbeat, dizziness, and weakness. These would start a few minutes to a few hours after the vaccination. What should I do?  If you think it is a severe allergic reaction or other emergency that can't wait, call 9-1-1 or get the person to the nearest hospital. Otherwise, call your doctor.  Afterward, the reaction should be reported to the Vaccine Adverse Event Reporting System (VAERS). Your doctor might file this report, or you can do it yourself through the VAERS web site at www.vaers.SamedayNews.es, or by calling 249-572-6355. ? VAERS is only  for reporting reactions. They do not give medical advice. 7. The National Vaccine Injury Compensation Program The [employer] Vaccine Injury Compensation Program (VICP) is a federal program that was created to compensate people who may have been injured by certain vaccines. Persons who believe they may have been injured by a vaccine can learn about the program and about filing a claim by calling (534) 562-4058 or visiting the [place] website at GoldCloset.com.ee. 8. How can I learn more?  Ask your doctor.  Call your local or state health department.  Contact the Centers for Disease Control and Prevention (CDC): ? Call (567)478-6959 (1-800-CDC-INFO) or ? Visit CDC's website at http://hunter.com/ CDC DTaP Vaccine (Diphtheria, Tetanus, and Pertussis) VIS (11/08/05) This information is not intended to replace advice given to you by your health care provider. Make sure you discuss any questions you have with your health care provider. Document Released: 04/08/2006 Document Revised: 03/01/2016 Document Reviewed: 03/01/2016 Elsevier Interactive Patient Education  2017 Elsevier Inc.    Constipation, Adult Constipation is when a person  has fewer bowel movements in a week than normal, has difficulty having a bowel movement, or has stools that are dry, hard, or larger than normal. Constipation may be caused by an underlying condition. It may become worse with age if a person takes certain medicines and does not take in enough fluids. Follow these instructions at home: Eating and drinking   Eat foods that have a lot of fiber, such as fresh fruits and vegetables, whole grains, and beans.  Limit foods that are high in fat, low in fiber, or overly processed, such as french fries, hamburgers, cookies, candies, and soda.  Drink enough fluid to keep your urine clear or pale yellow. General instructions  Exercise regularly or as told by your health care provider.  Go to the restroom when  you have the urge to go. Do not hold it in.  Take over-the-counter and prescription medicines only as told by your health care provider. These include any fiber supplements.  Practice pelvic floor retraining exercises, such as deep breathing while relaxing the lower abdomen and pelvic floor relaxation during bowel movements.  Watch your condition for any changes.  Keep all follow-up visits as told by your health care provider. This is important. Contact a health care provider if:  You have pain that gets worse.  You have a fever.  You do not have a bowel movement after 4 days.  You vomit.  You are not hungry.  You lose weight.  You are bleeding from the anus.  You have thin, pencil-like stools. Get help right away if:  You have a fever and your symptoms suddenly get worse.  You leak stool or have blood in your stool.  Your abdomen is bloated.  You have severe pain in your abdomen.  You feel dizzy or you faint. This information is not intended to replace advice given to you by your health care provider. Make sure you discuss any questions you have with your health care provider. Document Released: 03/09/2004 Document Revised: 12/30/2015 Document Reviewed: 11/30/2015 Elsevier Interactive Patient Education  2018 [employer].    Costochondritis Costochondritis is swelling and irritation (inflammation) of the tissue (cartilage) that connects your ribs to your breastbone (sternum). This causes pain in the front of your chest. The pain usually starts gradually and involves more than one rib. What are the causes? The exact cause of this condition is not always known. It results from stress on the cartilage where your ribs attach to your sternum. The cause of this stress could be:  Chest injury (trauma).  Exercise or activity, such as lifting.  Severe coughing.  What increases the risk? You may be at higher risk for this condition if you:  Are female.  Are 25?57  years old.  Recently started a new exercise or work activity.  Have low levels of vitamin D.  Have a condition that makes you cough frequently.  What are the signs or symptoms? The main symptom of this condition is chest pain. The pain:  Usually starts gradually and can be sharp or dull.  Gets worse with deep breathing, coughing, or exercise.  Gets better with rest.  May be worse when you press on the sternum-rib connection (tenderness).  How is this diagnosed? This condition is diagnosed based on your symptoms, medical history, and a physical exam. Your health care provider will check for tenderness when pressing on your sternum. This is the most important finding. You may also have tests to rule out other causes of  chest pain. These may include:  A chest X-ray to check for lung problems.  An electrocardiogram (ECG) to see if you have a heart problem that could be causing the pain.  An imaging scan to rule out a chest or rib fracture.  How is this treated? This condition usually goes away on its own over time. Your health care provider may prescribe an NSAID to reduce pain and inflammation. Your health care provider may also suggest that you:  Rest and avoid activities that make pain worse.  Apply heat or cold to the area to reduce pain and inflammation.  Do exercises to stretch your chest muscles.  If these treatments do not help, your health care provider may inject a numbing medicine at the sternum-rib connection to help relieve the pain. Follow these instructions at home:  Avoid activities that make pain worse. This includes any activities that use chest, abdominal, and side muscles.  If directed, put ice on the painful area: ? Put ice in a plastic bag. ? Place a towel between your skin and the bag. ? Leave the ice on for 20 minutes, 2-3 times a day.  If directed, apply heat to the affected area as often as told by your health care provider. Use the heat source  that your health care provider recommends, such as a moist heat pack or a heating pad. ? Place a towel between your skin and the heat source. ? Leave the heat on for 20-30 minutes. ? Remove the heat if your skin turns bright red. This is especially important if you are unable to feel pain, heat, or cold. You may have a greater risk of getting burned.  Take over-the-counter and prescription medicines only as told by your health care provider.  Return to your normal activities as told by your health care provider. Ask your health care provider what activities are safe for you.  Keep all follow-up visits as told by your health care provider. This is important. Contact a health care provider if:  You have chills or a fever.  Your pain does not go away or it gets worse.  You have a cough that does not go away (is persistent). Get help right away if:  You have shortness of breath. This information is not intended to replace advice given to you by your health care provider. Make sure you discuss any questions you have with your health care provider. Document Released: 03/21/2005 Document Revised: 12/30/2015 Document Reviewed: 10/05/2015 Elsevier Interactive Patient Education  [employer].

## 2018-04-18 NOTE — Progress Notes (Signed)
Pre visit review using our clinic review tool, if applicable. No additional management support is needed unless otherwise documented below in the visit note. 

## 2018-05-08 DIAGNOSIS — M9905 Segmental and somatic dysfunction of pelvic region: Secondary | ICD-10-CM | POA: Diagnosis not present

## 2018-05-08 DIAGNOSIS — M5416 Radiculopathy, lumbar region: Secondary | ICD-10-CM | POA: Diagnosis not present

## 2018-05-08 DIAGNOSIS — M62830 Muscle spasm of back: Secondary | ICD-10-CM | POA: Diagnosis not present

## 2018-05-08 DIAGNOSIS — M9903 Segmental and somatic dysfunction of lumbar region: Secondary | ICD-10-CM | POA: Diagnosis not present

## 2018-06-12 ENCOUNTER — Encounter: Payer: Self-pay | Admitting: Internal Medicine

## 2018-06-16 ENCOUNTER — Telehealth: Payer: Self-pay | Admitting: Internal Medicine

## 2018-06-16 NOTE — Telephone Encounter (Signed)
Left message for patient to return call back. [facility] may give results and obtain information.  

## 2018-06-16 NOTE — Telephone Encounter (Signed)
Called pt reviewed labs 06/12/18  LDH elevated 262 we can recheck this in future and AST one of liver enzymes elevated 43 [place] 02/07/18 with multiple liver cysts  -is she taking any otc herbal supplements?   Cholesterol total 153, , TG 53, HDL 85, LDL 57 good  TSH 2.540 normal other thyroid labs normal  WBC 5.7, H/H 13.5/42.2/plts 269 blood cts normal  Vit D 33.7 normal   TMS

## 2018-06-24 DIAGNOSIS — M9905 Segmental and somatic dysfunction of pelvic region: Secondary | ICD-10-CM | POA: Diagnosis not present

## 2018-06-24 DIAGNOSIS — M5416 Radiculopathy, lumbar region: Secondary | ICD-10-CM | POA: Diagnosis not present

## 2018-06-24 DIAGNOSIS — M62830 Muscle spasm of back: Secondary | ICD-10-CM | POA: Diagnosis not present

## 2018-06-24 DIAGNOSIS — M9903 Segmental and somatic dysfunction of lumbar region: Secondary | ICD-10-CM | POA: Diagnosis not present

## 2018-07-29 DIAGNOSIS — M5416 Radiculopathy, lumbar region: Secondary | ICD-10-CM | POA: Diagnosis not present

## 2018-07-29 DIAGNOSIS — M9903 Segmental and somatic dysfunction of lumbar region: Secondary | ICD-10-CM | POA: Diagnosis not present

## 2018-07-29 DIAGNOSIS — M62830 Muscle spasm of back: Secondary | ICD-10-CM | POA: Diagnosis not present

## 2018-07-29 DIAGNOSIS — M9905 Segmental and somatic dysfunction of pelvic region: Secondary | ICD-10-CM | POA: Diagnosis not present

## 2018-08-04 DIAGNOSIS — S63602A Unspecified sprain of left thumb, initial encounter: Secondary | ICD-10-CM | POA: Diagnosis not present

## 2018-09-04 DIAGNOSIS — M9905 Segmental and somatic dysfunction of pelvic region: Secondary | ICD-10-CM | POA: Diagnosis not present

## 2018-09-04 DIAGNOSIS — M9903 Segmental and somatic dysfunction of lumbar region: Secondary | ICD-10-CM | POA: Diagnosis not present

## 2018-09-04 DIAGNOSIS — M62830 Muscle spasm of back: Secondary | ICD-10-CM | POA: Diagnosis not present

## 2018-09-04 DIAGNOSIS — M5416 Radiculopathy, lumbar region: Secondary | ICD-10-CM | POA: Diagnosis not present

## 2018-09-09 DIAGNOSIS — M20012 Mallet finger of left finger(s): Secondary | ICD-10-CM | POA: Diagnosis not present

## 2018-10-21 ENCOUNTER — Ambulatory Visit: Payer: BLUE CROSS/BLUE SHIELD | Admitting: Internal Medicine

## 2018-10-23 DIAGNOSIS — M9903 Segmental and somatic dysfunction of lumbar region: Secondary | ICD-10-CM | POA: Diagnosis not present

## 2018-10-23 DIAGNOSIS — M9905 Segmental and somatic dysfunction of pelvic region: Secondary | ICD-10-CM | POA: Diagnosis not present

## 2018-10-23 DIAGNOSIS — M5416 Radiculopathy, lumbar region: Secondary | ICD-10-CM | POA: Diagnosis not present

## 2018-10-23 DIAGNOSIS — M62830 Muscle spasm of back: Secondary | ICD-10-CM | POA: Diagnosis not present

## 2018-11-20 DIAGNOSIS — M62830 Muscle spasm of back: Secondary | ICD-10-CM | POA: Diagnosis not present

## 2018-11-20 DIAGNOSIS — M9903 Segmental and somatic dysfunction of lumbar region: Secondary | ICD-10-CM | POA: Diagnosis not present

## 2018-11-20 DIAGNOSIS — M9905 Segmental and somatic dysfunction of pelvic region: Secondary | ICD-10-CM | POA: Diagnosis not present

## 2018-11-20 DIAGNOSIS — M5416 Radiculopathy, lumbar region: Secondary | ICD-10-CM | POA: Diagnosis not present

## 2018-11-25 ENCOUNTER — Ambulatory Visit: Payer: BLUE CROSS/BLUE SHIELD | Admitting: Internal Medicine

## 2018-12-25 DIAGNOSIS — M5416 Radiculopathy, lumbar region: Secondary | ICD-10-CM | POA: Diagnosis not present

## 2018-12-25 DIAGNOSIS — M9905 Segmental and somatic dysfunction of pelvic region: Secondary | ICD-10-CM | POA: Diagnosis not present

## 2018-12-25 DIAGNOSIS — M9903 Segmental and somatic dysfunction of lumbar region: Secondary | ICD-10-CM | POA: Diagnosis not present

## 2018-12-25 DIAGNOSIS — M62830 Muscle spasm of back: Secondary | ICD-10-CM | POA: Diagnosis not present

## 2019-01-01 ENCOUNTER — Ambulatory Visit: Payer: BLUE CROSS/BLUE SHIELD | Admitting: Internal Medicine

## 2019-01-29 DIAGNOSIS — M9903 Segmental and somatic dysfunction of lumbar region: Secondary | ICD-10-CM | POA: Diagnosis not present

## 2019-01-29 DIAGNOSIS — M9905 Segmental and somatic dysfunction of pelvic region: Secondary | ICD-10-CM | POA: Diagnosis not present

## 2019-01-29 DIAGNOSIS — M62830 Muscle spasm of back: Secondary | ICD-10-CM | POA: Diagnosis not present

## 2019-01-29 DIAGNOSIS — M5416 Radiculopathy, lumbar region: Secondary | ICD-10-CM | POA: Diagnosis not present

## 2019-02-25 ENCOUNTER — Ambulatory Visit: Payer: BC Managed Care – PPO | Admitting: Internal Medicine

## 2019-03-12 DIAGNOSIS — M5416 Radiculopathy, lumbar region: Secondary | ICD-10-CM | POA: Diagnosis not present

## 2019-03-12 DIAGNOSIS — M9905 Segmental and somatic dysfunction of pelvic region: Secondary | ICD-10-CM | POA: Diagnosis not present

## 2019-03-12 DIAGNOSIS — M9903 Segmental and somatic dysfunction of lumbar region: Secondary | ICD-10-CM | POA: Diagnosis not present

## 2019-03-12 DIAGNOSIS — M62830 Muscle spasm of back: Secondary | ICD-10-CM | POA: Diagnosis not present

## 2019-03-24 ENCOUNTER — Other Ambulatory Visit: Payer: Self-pay

## 2019-03-25 ENCOUNTER — Encounter: Payer: Self-pay | Admitting: Internal Medicine

## 2019-03-25 ENCOUNTER — Ambulatory Visit: Payer: BC Managed Care – PPO | Admitting: Internal Medicine

## 2019-03-25 ENCOUNTER — Other Ambulatory Visit: Payer: Self-pay

## 2019-03-25 VITALS — BP 132/82 | HR 68 | Temp 98.2°F | Ht 66.5 in | Wt 176.2 lb

## 2019-03-25 DIAGNOSIS — R109 Unspecified abdominal pain: Secondary | ICD-10-CM

## 2019-03-25 DIAGNOSIS — R748 Abnormal levels of other serum enzymes: Secondary | ICD-10-CM | POA: Insufficient documentation

## 2019-03-25 DIAGNOSIS — R740 Nonspecific elevation of levels of transaminase and lactic acid dehydrogenase [LDH]: Secondary | ICD-10-CM

## 2019-03-25 DIAGNOSIS — Z1231 Encounter for screening mammogram for malignant neoplasm of breast: Secondary | ICD-10-CM

## 2019-03-25 DIAGNOSIS — R1031 Right lower quadrant pain: Secondary | ICD-10-CM | POA: Insufficient documentation

## 2019-03-25 DIAGNOSIS — Z0000 Encounter for general adult medical examination without abnormal findings: Secondary | ICD-10-CM

## 2019-03-25 DIAGNOSIS — Z13818 Encounter for screening for other digestive system disorders: Secondary | ICD-10-CM

## 2019-03-25 DIAGNOSIS — R7402 Elevation of levels of lactic acid dehydrogenase (LDH): Secondary | ICD-10-CM

## 2019-03-25 DIAGNOSIS — Z1159 Encounter for screening for other viral diseases: Secondary | ICD-10-CM

## 2019-03-25 NOTE — Progress Notes (Addendum)
Chief Complaint  Patient presents with  . Follow-up   F/u  1. C/o right lower back pain that radiates to right flank, right lower abdomen 02/2019 for 1-2 days was bad 6/10 now 1/10 dull pain and discomfort. When more painful she tried Tylenol which helped 2. Annual   Review of Systems  Constitutional: Negative for weight loss.  HENT: Negative for hearing loss.   Eyes: Negative for blurred vision.  Respiratory: Negative for shortness of breath.   Cardiovascular: Negative for chest pain.  Gastrointestinal: Positive for abdominal pain.  Genitourinary: Positive for flank pain. Negative for dysuria.  Musculoskeletal: Positive for back pain.  Skin: Negative for rash.  Neurological: Negative for headaches.  Psychiatric/Behavioral: Negative for depression.   Past Medical History:  Diagnosis Date  . Abnormal Pap smear of cervix 2002   had colpo and freezing and all neg since then  . Endometrial polyp   . Folliculitis    buttocks Dr. Nicole Kindred  . History of chicken pox   . History of mammogram 05/08/2011   cat 1  . History of Papanicolaou smear of cervix 03/14/2011   -/-  . Irregular bleeding   . Kidney stone on left side   . Liver cyst    Past Surgical History:  Procedure Laterality Date  . CERVICAL POLYPECTOMY     2016  . CESAREAN SECTION     1996  . COLPOSCOPY  2002   nl paps since   . CRYOTHERAPY  2002  . DILATION AND CURETTAGE OF UTERUS     1999 2/2 miscarriage   . FOOT SURGERY     removal of bunion 2011  . HAND SURGERY     1968, 1981  . HERNIA REPAIR     umbilical [id number]  . HYSTEROSCOPY W/D&C N/A 12/21/2014   Procedure: DILATATION AND CURETTAGE /HYSTEROSCOPY;  Surgeon: Honor Loh Ward, MD;  Location: [facility] ORS;  Service: Gynecology;  Laterality: N/A;  . KNEE ARTHROSCOPY     ACL tear 1984  . TUBAL LIGATION     2005   Family History  Problem Relation Age of Onset  . Breast cancer Maternal Grandmother   . Brain cancer Maternal Grandmother   . Cancer Maternal  Grandmother        breast and brain   . Breast cancer Paternal Grandmother 3  . Cancer Paternal Grandmother        breast   . Hypertension Mother   . Other Mother        liver cysts  . Brain cancer Father 62       anaplastic astrocyntoma level 3  . Cancer Father        brain astrocytoma   . Depression Sister   . Diabetes Brother    Social History   Socioeconomic History  . Marital status: Married    Spouse name: Not on file  . Number of children: 3  . Years of education: Not on file  . Highest education level: Not on file  Occupational History  . Not on file  Social Needs  . Financial resource strain: Not on file  . Food insecurity    Worry: Not on file    Inability: Not on file  . Transportation needs    Medical: Not on file    Non-medical: Not on file  Tobacco Use  . Smoking status: Never Smoker  . Smokeless tobacco: Never Used  Substance and Sexual Activity  . Alcohol use: Yes    Comment: social  .  Drug use: No  . Sexual activity: Yes    Birth control/protection: Surgical  Lifestyle  . Physical activity    Days per week: 1 day    Minutes per session: 30 min  . Stress: To some extent  Relationships  . Social Herbalist on phone: More than three times a week    Gets together: Never    Attends religious service: Never    Active member of club or organization: No    Attends meetings of clubs or organizations: Never    Relationship status: Married  . Intimate partner violence    Fear of current or ex partner: No    Emotionally abused: No    Physically abused: No    Forced sexual activity: No  Other Topics Concern  . Not on file  Social History Narrative   [place]    Married 3 kids, married 32 years as of 12/2017    Never smoker    No guns, safe in relationship, wears seat belt    Current Meds  Medication Sig  . Multiple Vitamin (MULTIVITAMIN) tablet Take 1 tablet by mouth daily.  . Vitamin D, Cholecalciferol, 1000  units CAPS Take 1 capsule by mouth daily.   Allergies  Allergen Reactions  . Sulfa Antibiotics Rash  . Chocolate   . Citrus   . Peanut-Containing Drug Products Hives  . Tomato   . Ibuprofen Hives   No results found for this or any previous visit (from the past 2160 hour(s)). Objective  Body mass index is 28.01 kg/m. Wt Readings from Last 3 Encounters:  03/25/19 176 lb 3.2 oz (79.9 kg)  04/18/18 178 lb 12.8 oz (81.1 kg)  01/16/18 175 lb 6.4 oz (79.6 kg)   Temp Readings from Last 3 Encounters:  03/25/19 98.2 F (36.8 C) (Oral)  04/18/18 98.5 F (36.9 C) (Oral)  01/16/18 98.1 F (36.7 C) (Oral)   BP Readings from Last 3 Encounters:  03/25/19 132/82  04/18/18 130/72  01/16/18 120/68   Pulse Readings from Last 3 Encounters:  03/25/19 68  04/18/18 65  01/16/18 72    Physical Exam Vitals signs and nursing note reviewed.  Constitutional:      Appearance: Normal appearance. She is well-developed, well-groomed and overweight.  HENT:     Head: Normocephalic and atraumatic.     Comments: +mask on   Eyes:     Conjunctiva/sclera: Conjunctivae normal.     Pupils: Pupils are equal, round, and reactive to light.  Cardiovascular:     Rate and Rhythm: Normal rate and regular rhythm.     Heart sounds: Normal heart sounds. No murmur.  Pulmonary:     Effort: Pulmonary effort is normal.     Breath sounds: Normal breath sounds.  Abdominal:     Tenderness: There is abdominal tenderness. There is right CVA tenderness.       Comments: Mild dull CVA ttp    Skin:    General: Skin is warm and dry.     Nails: There is no clubbing.   Neurological:     General: No focal deficit present.     Mental Status: She is alert and oriented to person, place, and time. Mental status is at baseline.     Gait: Gait normal.  Psychiatric:        Attention and Perception: Attention and perception normal.        Mood and Affect: Mood and affect normal.  Speech: Speech normal.         Behavior: Behavior normal. Behavior is cooperative.        Thought Content: Thought content normal.        Cognition and Memory: Cognition and memory normal.        Judgment: Judgment normal.     Assessment  Plan  Annual physical exam 04/06/19 labs [facility] cbc 5.1 13.2/39.9 plts 259, glucose 94, BUN 16, Cr 1.03, GFR 60, Na 142, K 4.3, Cl 103, CO2 25, Calcium 10.1, total protein 7.4, albumin 4.4, total bili 0.4, AST 23, ALT 17, urine normal hcv negative, ldh 173 normal 119-226, urine culture no UTI   B/w sch at work 06/11/19 (I.e lipid and TSH) she had 06/12/18 and normal both labs see scanned labs   Last flu shot 2017, declines today 03/25/2019 Tdap ? Last consider in future will get at work  -given Rx today 03/25/19 disc shingrix It today call back if interested given info Declines STD testing   Pap 2017 [facility] likely need another 2020 -saw Dr. Vikki Ports Ward when at [facility] she is agreeable to see [facility] again will let me know when ready for referral and will check with insurance to see if will cover  -of note ECC bx 2016 negative   mammo neg 02/13/18 neg referred today [place]   Colonoscopy rec with FH, not good candidate  cologuard  -pt will let me know will call back about colonoscopy husband saw Dr. Jonathon Bellows she may want to see them also disc [facility] [place] and [place] she will let me   -h/o 1 brother with tubular adenoma polyps and another with colon resection for mass 1/3 intestine and [place]   Dermatology Dr. 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   Reviewed labs 06/12/12 vit D 33.7   Declines  HIV  rec healthy diet and exercise   Flank pain - Plan: Comprehensive metabolic panel, CBC with Differential/Platelet, Urinalysis, Routine w reflex microscopic, Urine Culture, CT Abdomen Pelvis Wo Contrast  Elevated LDH 06/12/18  262 sl normal 226- Plan: Lactate Dehydrogenase (LDH)  will f/u   Elevated liver enzymes - Plan: Hepatitis C antibody, CT Abdomen Pelvis Wo Contrast cmet   Right lower quadrant abdominal pain - Plan: CT Abdomen Pelvis Wo Contrast    Reviewed labs 08/10/20 [facility] DHEA 118 normal  Testosterone 7 normal, free Test 1.5 normal  tsh 4.290 normal  Vitamin D 31.9 Ferritin 102 normal   Provider: Dr. Olivia Mackie McLean-Scocuzza-[facility]

## 2019-03-25 NOTE — Patient Instructions (Addendum)
Tdap vaccine   Shingles vaccine x 2 doses   [facility] you will need referral likely   Dr. Jonathon Bellows [facility] they do colonoscopies [facility] in [place] in [place]   Recombinant Zoster (Shingles) Vaccine: What You Need to Know 1. Why get vaccinated? Recombinant zoster (shingles) vaccine can prevent shingles. Shingles (also called herpes zoster, or just zoster) is a painful skin rash, usually with blisters. In addition to the rash, shingles can cause fever, headache, chills, or upset stomach. More rarely, shingles can lead to pneumonia, hearing problems, blindness, brain inflammation (encephalitis), or death. The most common complication of shingles is long-term nerve pain called postherpetic neuralgia (PHN). PHN occurs in the areas where the shingles rash was, even after the rash clears up. It can last for months or years after the rash goes away. The pain from PHN can be severe and debilitating. About 10 to 18% of people who get shingles will experience PHN. The risk of PHN increases with age. An older adult with shingles is more likely to develop PHN and have longer lasting and more severe pain than a younger person with shingles. Shingles is caused by the varicella zoster virus, the same virus that causes chickenpox. After you have chickenpox, the virus stays in your body and can cause shingles later in life. Shingles cannot be passed from one person to another, but the virus that causes shingles can spread and cause chickenpox in someone who had never had chickenpox or received chickenpox vaccine. 2. Recombinant shingles vaccine Recombinant shingles vaccine provides strong protection against shingles. By preventing shingles, recombinant shingles vaccine also protects against PHN. Recombinant shingles vaccine is the preferred vaccine for the prevention of shingles. However, a different vaccine, live shingles vaccine, may be used in  some circumstances. The recombinant shingles vaccine is recommended for adults 50 years and older without serious immune problems. It is given as a two-dose series. This vaccine is also recommended for people who have already gotten another type of shingles vaccine, the live shingles vaccine. There is no live virus in this vaccine. Shingles vaccine may be given at the same time as other vaccines. 3. Talk with your health care provider Tell your vaccine provider if the person getting the vaccine:  Has had an allergic reaction after a previous dose of recombinant shingles vaccine, or has any severe, life-threatening allergies.  Is pregnant or breastfeeding.  Is currently experiencing an episode of shingles. In some cases, your health care provider may decide to postpone shingles vaccination to a future visit. People with minor illnesses, such as a cold, may be vaccinated. People who are moderately or severely ill should usually wait until they recover before getting recombinant shingles vaccine. Your health care provider can give you more information. 4. Risks of a vaccine reaction  A sore arm with mild or moderate pain is very common after recombinant shingles vaccine, affecting about 80% of vaccinated people. Redness and swelling can also happen at the site of the injection.  Tiredness, muscle pain, headache, shivering, fever, stomach pain, and nausea happen after vaccination in more than half of people who receive recombinant shingles vaccine. In clinical trials, about 1 out of 6 people who got recombinant zoster vaccine experienced side effects that prevented them from doing regular activities. Symptoms usually went away on their own in 2 to 3 days. You should still get the second dose of recombinant zoster vaccine even if you had  one of these reactions after the first dose. People sometimes faint after medical procedures, including vaccination. Tell your provider if you feel dizzy or have  vision changes or ringing in the ears. As with any medicine, there is a very remote chance of a vaccine causing a severe allergic reaction, other serious injury, or death. 5. What if there is a serious problem? An allergic reaction could occur after the vaccinated person leaves the clinic. If you see signs of a severe allergic reaction (hives, swelling of the face and throat, difficulty breathing, a fast heartbeat, dizziness, or weakness), call 9-1-1 and get the person to the nearest hospital. For other signs that concern you, call your health care provider. Adverse reactions should be reported to the Vaccine Adverse Event Reporting System (VAERS). Your health care provider will usually file this report, or you can do it yourself. Visit the VAERS website at www.vaers.SamedayNews.es or call 605-444-1058. VAERS is only for reporting reactions, and VAERS staff do not give medical advice. 6. How can I learn more?  Ask your health care provider.  Call your local or state health department.  Contact the Centers for Disease Control and Prevention (CDC): ? Call 817-164-2475 (1-800-CDC-INFO) or ? Visit CDC's website at http://hunter.com/ Vaccine Information Statement Recombinant Zoster Vaccine (04/23/2018) This information is not intended to replace advice given to you by your health care provider. Make sure you discuss any questions you have with your health care provider. Document Released: 08/21/2016 Document Revised: 09/30/2018 Document Reviewed: 01/15/2018 Elsevier Patient Education  [employer].  https://www.cdc.gov/vaccines/hcp/vis/vis-statements/tdap.pdf">  Tdap Vaccine (Tetanus, Diphtheria and Pertussis): What You Need to Know 1. Why get vaccinated? Tetanus, diphtheria and pertussis are very serious diseases. Tdap vaccine can protect [place] from these diseases. And, Tdap vaccine given to pregnant women can protect newborn babies against pertussis.Marland Kitchen TETANUS (Lockjaw) is rare in the [place] today. It causes painful muscle tightening and stiffness, usually all over the body.  It can lead to tightening of muscles in the head and neck so you can't open your mouth, swallow, or sometimes even breathe. Tetanus kills about 1 out of 10 people who are infected even after receiving the best medical care. DIPHTHERIA is also rare in the [place] today. It can cause a thick coating to form in the back of the throat.  It can lead to breathing problems, heart failure, paralysis, and death. PERTUSSIS (Whooping Cough) causes severe coughing spells, which can cause difficulty breathing, vomiting and disturbed sleep.  It can also lead to weight loss, incontinence, and rib fractures. Up to 2 in 100 adolescents and 5 in 100 adults with pertussis are hospitalized or have complications, which could include pneumonia or death. These diseases are caused by bacteria. Diphtheria and pertussis are spread from person to person through secretions from coughing or sneezing. Tetanus enters the body through cuts, scratches, or wounds. Before vaccines, as many as 200,000 cases of diphtheria, 200,000 cases of pertussis, and hundreds of cases of tetanus, were reported in the [place] each year. Since vaccination began, reports of cases for tetanus and diphtheria have dropped by about 99% and for pertussis by about 80%. 2. Tdap vaccine Tdap vaccine can protect adolescents and adults from tetanus, diphtheria, and pertussis. One dose of Tdap is routinely given at age 52 or 72. People who did not get Tdap at that age should get it as soon as possible. Tdap is especially important for healthcare professionals and anyone having close contact with a baby younger  than 12 months. Pregnant women should get a dose of Tdap during every pregnancy, to protect the newborn from pertussis. Infants are most at risk for severe, life-threatening complications from pertussis. Another vaccine, called Td, protects against  tetanus and diphtheria, but not pertussis. A Td booster should be given every 10 years. Tdap may be given as one of these boosters if you have never gotten Tdap before. Tdap may also be given after a severe cut or burn to prevent tetanus infection. Your doctor or the person giving you the vaccine can give you more information. Tdap may safely be given at the same time as other vaccines. 3. Some people should not get this vaccine  A person who has ever had a life-threatening allergic reaction after a previous dose of any diphtheria, tetanus or pertussis containing vaccine, OR has a severe allergy to any part of this vaccine, should not get Tdap vaccine. Tell the person giving the vaccine about any severe allergies.  Anyone who had coma or long repeated seizures within 7 days after a childhood dose of DTP or DTaP, or a previous dose of Tdap, should not get Tdap, unless a cause other than the vaccine was found. They can still get Td.  Talk to your doctor if you: ? have seizures or another nervous system problem, ? had severe pain or swelling after any vaccine containing diphtheria, tetanus or pertussis, ? ever had a condition called Guillain-Barr Syndrome (GBS), ? aren't feeling well on the day the shot is scheduled. 4. Risks With any medicine, including vaccines, there is a chance of side effects. These are usually mild and go away on their own. Serious reactions are also possible but are rare. Most people who get Tdap vaccine do not have any problems with it. Mild problems following Tdap (Did not interfere with activities)  Pain where the shot was given (about 3 in 4 adolescents or 2 in 3 adults)  Redness or swelling where the shot was given (about 1 person in 5)  Mild fever of at least 100.71F (up to about 1 in 25 adolescents or 1 in 100 adults)  Headache (about 3 or 4 people in 10)  Tiredness (about 1 person in 3 or 4)  Nausea, vomiting, diarrhea, stomach ache (up to 1 in 4  adolescents or 1 in 10 adults)  Chills, sore joints (about 1 person in 10)  Body aches (about 1 person in 3 or 4)  Rash, swollen glands (uncommon) Moderate problems following Tdap (Interfered with activities, but did not require medical attention)  Pain where the shot was given (up to 1 in 5 or 6)  Redness or swelling where the shot was given (up to about 1 in 16 adolescents or 1 in 12 adults)  Fever over 102F (about 1 in 100 adolescents or 1 in 250 adults)  Headache (about 1 in 7 adolescents or 1 in 10 adults)  Nausea, vomiting, diarrhea, stomach ache (up to 1 or 3 people in 100)  Swelling of the entire arm where the shot was given (up to about 1 in 500). Severe problems following Tdap (Unable to perform usual activities; required medical attention)  Swelling, severe pain, bleeding and redness in the arm where the shot was given (rare). Problems that could happen after any vaccine:  People sometimes faint after a medical procedure, including vaccination. Sitting or lying down for about 15 minutes can help prevent fainting, and injuries caused by a fall. Tell your doctor if you feel dizzy, or  have vision changes or ringing in the ears.  Some people get severe pain in the shoulder and have difficulty moving the arm where a shot was given. This happens very rarely.  Any medication can cause a severe allergic reaction. Such reactions from a vaccine are very rare, estimated at fewer than 1 in a million doses, and would happen within a few minutes to a few hours after the vaccination. As with any medicine, there is a very remote chance of a vaccine causing a serious injury or death. The safety of vaccines is always being monitored. For more information, visit: http://www.aguilar.org/ 5. What if there is a serious problem? What should I look for?  Look for anything that concerns you, such as signs of a severe allergic reaction, very high fever, or unusual behavior. Signs of a  severe allergic reaction can include hives, swelling of the face and throat, difficulty breathing, a fast heartbeat, dizziness, and weakness. These would usually start a few minutes to a few hours after the vaccination. What should I do?  If you think it is a severe allergic reaction or other emergency that can't wait, call 9-1-1 or get the person to the nearest hospital. Otherwise, call your doctor.  Afterward, the reaction should be reported to the Vaccine Adverse Event Reporting System (VAERS). Your doctor might file this report, or you can do it yourself through the VAERS web site at www.vaers.SamedayNews.es, or by calling 253-762-0430. VAERS does not give medical advice. 6. The National Vaccine Injury Compensation Program The [employer] Vaccine Injury Compensation Program (VICP) is a federal program that was created to compensate people who may have been injured by certain vaccines. Persons who believe they may have been injured by a vaccine can learn about the program and about filing a claim by calling (913)361-7792 or visiting the [employer] website at GoldCloset.com.ee. There is a time limit to file a claim for compensation. 7. How can I learn more?  Ask your doctor. He or she can give you the vaccine package insert or suggest other sources of information.  Call your local or state health department.  Contact the Centers for Disease Control and Prevention (CDC): ? Call 301-473-7712 (1-800-CDC-INFO) or ? Visit CDC's website at http://hunter.com/ Vaccine Information Statement Tdap Vaccine (08/18/2013) This information is not intended to replace advice given to you by your health care provider. Make sure you discuss any questions you have with your health care provider. Document Released: 12/11/2011 Document Revised: 01/27/2018 Document Reviewed: 01/27/2018 Elsevier Interactive Patient Education  [employer].

## 2019-03-27 ENCOUNTER — Encounter: Payer: Self-pay | Admitting: Internal Medicine

## 2019-04-02 ENCOUNTER — Other Ambulatory Visit: Payer: Self-pay

## 2019-04-02 DIAGNOSIS — Z20822 Contact with and (suspected) exposure to covid-19: Secondary | ICD-10-CM

## 2019-04-02 DIAGNOSIS — Z20828 Contact with and (suspected) exposure to other viral communicable diseases: Secondary | ICD-10-CM | POA: Diagnosis not present

## 2019-04-04 LAB — NOVEL CORONAVIRUS, NAA: SARS-CoV-2, NAA: NOT DETECTED

## 2019-04-06 LAB — HM HEPATITIS C SCREENING LAB: HM Hepatitis Screen: NEGATIVE

## 2019-04-08 ENCOUNTER — Encounter: Payer: Self-pay | Admitting: Internal Medicine

## 2019-04-08 ENCOUNTER — Telehealth: Payer: Self-pay | Admitting: Internal Medicine

## 2019-04-08 NOTE — Telephone Encounter (Signed)
04/06/19 labs [facility]  Blood cts normal  Kidney #s slight elevated increase water intake avoid nsaids  Liver enzymes normal  No UTI  Hep C negative   Labs [facility] with job cbc 5.1 13.2/39.9 plts 259, glucose 94, BUN 16, Cr 1.03, GFR 60, Na 142, K 4.3, Cl 103, CO2 25, Calcium 10.1, total protein 7.4, albumin 4.4, total bili 0.4, AST 23, ALT 17, urine normal hcv negative, ldh 173 normal 119-226, urine culture no UTI

## 2019-04-10 NOTE — Telephone Encounter (Signed)
Left message for patient to return call back. [facility] may give results and obtain information.  

## 2019-04-14 ENCOUNTER — Ambulatory Visit: Payer: BC Managed Care – PPO

## 2019-04-21 ENCOUNTER — Other Ambulatory Visit: Payer: Self-pay

## 2019-04-21 ENCOUNTER — Ambulatory Visit
Admission: RE | Admit: 2019-04-21 | Discharge: 2019-04-21 | Disposition: A | Discharge status: Home / Self Care | Payer: BC Managed Care – PPO | Source: Ambulatory Visit | Attending: Internal Medicine | Admitting: Internal Medicine

## 2019-04-21 ENCOUNTER — Other Ambulatory Visit: Payer: Self-pay | Admitting: Internal Medicine

## 2019-04-21 DIAGNOSIS — R748 Abnormal levels of other serum enzymes: Secondary | ICD-10-CM | POA: Insufficient documentation

## 2019-04-21 DIAGNOSIS — R1031 Right lower quadrant pain: Secondary | ICD-10-CM | POA: Diagnosis not present

## 2019-04-21 DIAGNOSIS — R7402 Elevation of levels of lactic acid dehydrogenase (LDH): Secondary | ICD-10-CM | POA: Insufficient documentation

## 2019-04-21 DIAGNOSIS — N200 Calculus of kidney: Secondary | ICD-10-CM | POA: Diagnosis not present

## 2019-04-21 DIAGNOSIS — R109 Unspecified abdominal pain: Secondary | ICD-10-CM | POA: Diagnosis not present

## 2019-04-23 ENCOUNTER — Ambulatory Visit: Payer: BC Managed Care – PPO | Admitting: Internal Medicine

## 2019-04-23 DIAGNOSIS — M9905 Segmental and somatic dysfunction of pelvic region: Secondary | ICD-10-CM | POA: Diagnosis not present

## 2019-04-23 DIAGNOSIS — M62830 Muscle spasm of back: Secondary | ICD-10-CM | POA: Diagnosis not present

## 2019-04-23 DIAGNOSIS — M5416 Radiculopathy, lumbar region: Secondary | ICD-10-CM | POA: Diagnosis not present

## 2019-04-23 DIAGNOSIS — M9903 Segmental and somatic dysfunction of lumbar region: Secondary | ICD-10-CM | POA: Diagnosis not present

## 2019-05-28 ENCOUNTER — Ambulatory Visit
Admission: RE | Admit: 2019-05-28 | Discharge: 2019-05-28 | Disposition: A | Discharge status: Home / Self Care | Payer: BC Managed Care – PPO | Source: Ambulatory Visit | Attending: Internal Medicine | Admitting: Internal Medicine

## 2019-05-28 DIAGNOSIS — M9903 Segmental and somatic dysfunction of lumbar region: Secondary | ICD-10-CM | POA: Diagnosis not present

## 2019-05-28 DIAGNOSIS — M5416 Radiculopathy, lumbar region: Secondary | ICD-10-CM | POA: Diagnosis not present

## 2019-05-28 DIAGNOSIS — Z1231 Encounter for screening mammogram for malignant neoplasm of breast: Secondary | ICD-10-CM | POA: Insufficient documentation

## 2019-05-28 DIAGNOSIS — M62830 Muscle spasm of back: Secondary | ICD-10-CM | POA: Diagnosis not present

## 2019-05-28 DIAGNOSIS — M9905 Segmental and somatic dysfunction of pelvic region: Secondary | ICD-10-CM | POA: Diagnosis not present

## 2019-06-25 DIAGNOSIS — M9903 Segmental and somatic dysfunction of lumbar region: Secondary | ICD-10-CM | POA: Diagnosis not present

## 2019-06-25 DIAGNOSIS — M5416 Radiculopathy, lumbar region: Secondary | ICD-10-CM | POA: Diagnosis not present

## 2019-06-25 DIAGNOSIS — M9905 Segmental and somatic dysfunction of pelvic region: Secondary | ICD-10-CM | POA: Diagnosis not present

## 2019-06-25 DIAGNOSIS — M62830 Muscle spasm of back: Secondary | ICD-10-CM | POA: Diagnosis not present

## 2019-07-02 DIAGNOSIS — Z1231 Encounter for screening mammogram for malignant neoplasm of breast: Secondary | ICD-10-CM | POA: Diagnosis not present

## 2019-07-02 DIAGNOSIS — Z01419 Encounter for gynecological examination (general) (routine) without abnormal findings: Secondary | ICD-10-CM | POA: Diagnosis not present

## 2019-07-02 DIAGNOSIS — Z1211 Encounter for screening for malignant neoplasm of colon: Secondary | ICD-10-CM | POA: Diagnosis not present

## 2019-07-02 DIAGNOSIS — Z124 Encounter for screening for malignant neoplasm of cervix: Secondary | ICD-10-CM | POA: Diagnosis not present

## 2019-07-02 LAB — HM PAP SMEAR

## 2019-07-23 DIAGNOSIS — M5416 Radiculopathy, lumbar region: Secondary | ICD-10-CM | POA: Diagnosis not present

## 2019-07-23 DIAGNOSIS — M9903 Segmental and somatic dysfunction of lumbar region: Secondary | ICD-10-CM | POA: Diagnosis not present

## 2019-07-23 DIAGNOSIS — M9905 Segmental and somatic dysfunction of pelvic region: Secondary | ICD-10-CM | POA: Diagnosis not present

## 2019-07-23 DIAGNOSIS — M62830 Muscle spasm of back: Secondary | ICD-10-CM | POA: Diagnosis not present

## 2019-08-27 DIAGNOSIS — M5416 Radiculopathy, lumbar region: Secondary | ICD-10-CM | POA: Diagnosis not present

## 2019-08-27 DIAGNOSIS — M62830 Muscle spasm of back: Secondary | ICD-10-CM | POA: Diagnosis not present

## 2019-08-27 DIAGNOSIS — M9905 Segmental and somatic dysfunction of pelvic region: Secondary | ICD-10-CM | POA: Diagnosis not present

## 2019-08-27 DIAGNOSIS — M9903 Segmental and somatic dysfunction of lumbar region: Secondary | ICD-10-CM | POA: Diagnosis not present

## 2019-10-08 DIAGNOSIS — M5416 Radiculopathy, lumbar region: Secondary | ICD-10-CM | POA: Diagnosis not present

## 2019-10-08 DIAGNOSIS — M62830 Muscle spasm of back: Secondary | ICD-10-CM | POA: Diagnosis not present

## 2019-10-08 DIAGNOSIS — M9903 Segmental and somatic dysfunction of lumbar region: Secondary | ICD-10-CM | POA: Diagnosis not present

## 2019-10-08 DIAGNOSIS — M9905 Segmental and somatic dysfunction of pelvic region: Secondary | ICD-10-CM | POA: Diagnosis not present

## 2019-11-12 DIAGNOSIS — M9905 Segmental and somatic dysfunction of pelvic region: Secondary | ICD-10-CM | POA: Diagnosis not present

## 2019-11-12 DIAGNOSIS — M9903 Segmental and somatic dysfunction of lumbar region: Secondary | ICD-10-CM | POA: Diagnosis not present

## 2019-11-12 DIAGNOSIS — M5416 Radiculopathy, lumbar region: Secondary | ICD-10-CM | POA: Diagnosis not present

## 2019-11-12 DIAGNOSIS — M62830 Muscle spasm of back: Secondary | ICD-10-CM | POA: Diagnosis not present

## 2019-12-17 DIAGNOSIS — M9903 Segmental and somatic dysfunction of lumbar region: Secondary | ICD-10-CM | POA: Diagnosis not present

## 2019-12-17 DIAGNOSIS — M5416 Radiculopathy, lumbar region: Secondary | ICD-10-CM | POA: Diagnosis not present

## 2019-12-17 DIAGNOSIS — M62830 Muscle spasm of back: Secondary | ICD-10-CM | POA: Diagnosis not present

## 2019-12-17 DIAGNOSIS — M9905 Segmental and somatic dysfunction of pelvic region: Secondary | ICD-10-CM | POA: Diagnosis not present

## 2020-01-28 DIAGNOSIS — M62830 Muscle spasm of back: Secondary | ICD-10-CM | POA: Diagnosis not present

## 2020-01-28 DIAGNOSIS — M9903 Segmental and somatic dysfunction of lumbar region: Secondary | ICD-10-CM | POA: Diagnosis not present

## 2020-01-28 DIAGNOSIS — M5416 Radiculopathy, lumbar region: Secondary | ICD-10-CM | POA: Diagnosis not present

## 2020-01-28 DIAGNOSIS — M9905 Segmental and somatic dysfunction of pelvic region: Secondary | ICD-10-CM | POA: Diagnosis not present

## 2020-03-03 DIAGNOSIS — M5416 Radiculopathy, lumbar region: Secondary | ICD-10-CM | POA: Diagnosis not present

## 2020-03-03 DIAGNOSIS — M9905 Segmental and somatic dysfunction of pelvic region: Secondary | ICD-10-CM | POA: Diagnosis not present

## 2020-03-03 DIAGNOSIS — M9903 Segmental and somatic dysfunction of lumbar region: Secondary | ICD-10-CM | POA: Diagnosis not present

## 2020-03-03 DIAGNOSIS — M62830 Muscle spasm of back: Secondary | ICD-10-CM | POA: Diagnosis not present

## 2020-04-21 DIAGNOSIS — M9905 Segmental and somatic dysfunction of pelvic region: Secondary | ICD-10-CM | POA: Diagnosis not present

## 2020-04-21 DIAGNOSIS — M62830 Muscle spasm of back: Secondary | ICD-10-CM | POA: Diagnosis not present

## 2020-04-21 DIAGNOSIS — M9903 Segmental and somatic dysfunction of lumbar region: Secondary | ICD-10-CM | POA: Diagnosis not present

## 2020-04-21 DIAGNOSIS — M5416 Radiculopathy, lumbar region: Secondary | ICD-10-CM | POA: Diagnosis not present

## 2020-05-26 DIAGNOSIS — M9903 Segmental and somatic dysfunction of lumbar region: Secondary | ICD-10-CM | POA: Diagnosis not present

## 2020-05-26 DIAGNOSIS — M62830 Muscle spasm of back: Secondary | ICD-10-CM | POA: Diagnosis not present

## 2020-05-26 DIAGNOSIS — M5416 Radiculopathy, lumbar region: Secondary | ICD-10-CM | POA: Diagnosis not present

## 2020-05-26 DIAGNOSIS — M9905 Segmental and somatic dysfunction of pelvic region: Secondary | ICD-10-CM | POA: Diagnosis not present

## 2020-05-31 DIAGNOSIS — Z0189 Encounter for other specified special examinations: Secondary | ICD-10-CM | POA: Diagnosis not present

## 2020-06-03 ENCOUNTER — Other Ambulatory Visit: Payer: Self-pay | Admitting: Obstetrics & Gynecology

## 2020-06-03 DIAGNOSIS — Z1231 Encounter for screening mammogram for malignant neoplasm of breast: Secondary | ICD-10-CM

## 2020-06-07 DIAGNOSIS — Z043 Encounter for examination and observation following other accident: Secondary | ICD-10-CM | POA: Diagnosis not present

## 2020-06-07 DIAGNOSIS — Z713 Dietary counseling and surveillance: Secondary | ICD-10-CM | POA: Diagnosis not present

## 2020-06-08 ENCOUNTER — Other Ambulatory Visit: Payer: Self-pay

## 2020-06-08 ENCOUNTER — Ambulatory Visit
Admission: RE | Admit: 2020-06-08 | Discharge: 2020-06-08 | Disposition: A | Discharge status: Home / Self Care | Payer: BC Managed Care – PPO | Source: Ambulatory Visit | Attending: Obstetrics & Gynecology | Admitting: Obstetrics & Gynecology

## 2020-06-08 DIAGNOSIS — Z1231 Encounter for screening mammogram for malignant neoplasm of breast: Secondary | ICD-10-CM

## 2020-07-07 DIAGNOSIS — M9903 Segmental and somatic dysfunction of lumbar region: Secondary | ICD-10-CM | POA: Diagnosis not present

## 2020-07-07 DIAGNOSIS — M62830 Muscle spasm of back: Secondary | ICD-10-CM | POA: Diagnosis not present

## 2020-07-07 DIAGNOSIS — M9905 Segmental and somatic dysfunction of pelvic region: Secondary | ICD-10-CM | POA: Diagnosis not present

## 2020-07-07 DIAGNOSIS — M5416 Radiculopathy, lumbar region: Secondary | ICD-10-CM | POA: Diagnosis not present

## 2020-07-13 DIAGNOSIS — Z1231 Encounter for screening mammogram for malignant neoplasm of breast: Secondary | ICD-10-CM | POA: Diagnosis not present

## 2020-07-13 DIAGNOSIS — Z01419 Encounter for gynecological examination (general) (routine) without abnormal findings: Secondary | ICD-10-CM | POA: Diagnosis not present

## 2020-07-13 DIAGNOSIS — Z1331 Encounter for screening for depression: Secondary | ICD-10-CM | POA: Diagnosis not present

## 2020-07-13 DIAGNOSIS — Z1211 Encounter for screening for malignant neoplasm of colon: Secondary | ICD-10-CM | POA: Diagnosis not present

## 2020-08-08 DIAGNOSIS — L659 Nonscarring hair loss, unspecified: Secondary | ICD-10-CM | POA: Diagnosis not present

## 2020-08-10 DIAGNOSIS — M9905 Segmental and somatic dysfunction of pelvic region: Secondary | ICD-10-CM | POA: Diagnosis not present

## 2020-08-10 DIAGNOSIS — M5416 Radiculopathy, lumbar region: Secondary | ICD-10-CM | POA: Diagnosis not present

## 2020-08-10 DIAGNOSIS — M9903 Segmental and somatic dysfunction of lumbar region: Secondary | ICD-10-CM | POA: Diagnosis not present

## 2020-08-10 DIAGNOSIS — M62830 Muscle spasm of back: Secondary | ICD-10-CM | POA: Diagnosis not present

## 2020-08-16 ENCOUNTER — Telehealth: Payer: Self-pay | Admitting: Internal Medicine

## 2020-08-16 NOTE — Telephone Encounter (Signed)
Please call pt to sch in person physical/health maintenance exam   Thanks

## 2020-08-17 NOTE — Telephone Encounter (Signed)
Pt states that she will call back later this year to schedule physical

## 2020-09-07 DIAGNOSIS — M62830 Muscle spasm of back: Secondary | ICD-10-CM | POA: Diagnosis not present

## 2020-09-07 DIAGNOSIS — M9905 Segmental and somatic dysfunction of pelvic region: Secondary | ICD-10-CM | POA: Diagnosis not present

## 2020-09-07 DIAGNOSIS — M9903 Segmental and somatic dysfunction of lumbar region: Secondary | ICD-10-CM | POA: Diagnosis not present

## 2020-09-07 DIAGNOSIS — M5416 Radiculopathy, lumbar region: Secondary | ICD-10-CM | POA: Diagnosis not present

## 2020-10-13 DIAGNOSIS — M5416 Radiculopathy, lumbar region: Secondary | ICD-10-CM | POA: Diagnosis not present

## 2020-10-13 DIAGNOSIS — M9903 Segmental and somatic dysfunction of lumbar region: Secondary | ICD-10-CM | POA: Diagnosis not present

## 2020-10-13 DIAGNOSIS — M9905 Segmental and somatic dysfunction of pelvic region: Secondary | ICD-10-CM | POA: Diagnosis not present

## 2020-10-13 DIAGNOSIS — M62830 Muscle spasm of back: Secondary | ICD-10-CM | POA: Diagnosis not present

## 2020-12-06 ENCOUNTER — Encounter: Payer: Self-pay | Admitting: Internal Medicine

## 2020-12-06 ENCOUNTER — Other Ambulatory Visit: Payer: Self-pay

## 2020-12-06 ENCOUNTER — Ambulatory Visit (INDEPENDENT_AMBULATORY_CARE_PROVIDER_SITE_OTHER): Payer: BC Managed Care – PPO | Admitting: Internal Medicine

## 2020-12-06 VITALS — BP 110/78 | HR 78 | Temp 97.8°F | Ht 66.5 in | Wt 184.0 lb

## 2020-12-06 DIAGNOSIS — K5909 Other constipation: Secondary | ICD-10-CM

## 2020-12-06 DIAGNOSIS — Z8371 Family history of colonic polyps: Secondary | ICD-10-CM

## 2020-12-06 DIAGNOSIS — R0789 Other chest pain: Secondary | ICD-10-CM

## 2020-12-06 DIAGNOSIS — Z0000 Encounter for general adult medical examination without abnormal findings: Secondary | ICD-10-CM | POA: Diagnosis not present

## 2020-12-06 DIAGNOSIS — Z1211 Encounter for screening for malignant neoplasm of colon: Secondary | ICD-10-CM

## 2020-12-06 DIAGNOSIS — L821 Other seborrheic keratosis: Secondary | ICD-10-CM

## 2020-12-06 DIAGNOSIS — Z1231 Encounter for screening mammogram for malignant neoplasm of breast: Secondary | ICD-10-CM

## 2020-12-06 DIAGNOSIS — L659 Nonscarring hair loss, unspecified: Secondary | ICD-10-CM

## 2020-12-06 DIAGNOSIS — Z83719 Family history of colon polyps, unspecified: Secondary | ICD-10-CM

## 2020-12-06 NOTE — Progress Notes (Signed)
Chief Complaint  Patient presents with   Constipation   Annual  1. 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 C/o hair loss and lesion to left thigh brown wants to get checked with f/u with Dr. Gabriel Earing collagen, nioxin  Stressors life mom and husband had prostate cancer 05/2020 and had surgery    Review of Systems  Constitutional:  Negative for weight loss.  HENT:  Negative for hearing loss.   Eyes:  Negative for blurred vision.  Respiratory:  Negative for shortness of breath.   Cardiovascular:  Negative for chest pain.  Gastrointestinal:  Positive for constipation.  Musculoskeletal:  Negative for falls and joint pain.  Skin:  Negative for rash.  Neurological:  Negative for headaches.  Psychiatric/Behavioral:         Stress  Past Medical History:  Diagnosis Date   Abnormal Pap smear of cervix 2002   had colpo and freezing and all neg since then   COVID-19    02/2020   Endometrial polyp    Folliculitis    buttocks Dr. Nicole Kindred   History of chicken pox    History of mammogram 05/08/2011   cat 1   History of Papanicolaou smear of cervix 03/14/2011   -/-   Irregular bleeding    Kidney stone on left side    Liver cyst    Shingles    2018 right abdomen   Past Surgical History:  Procedure Laterality Date   CERVICAL POLYPECTOMY     2016   [place]  2002   nl paps since    CRYOTHERAPY  2002   [place] 2/2 miscarriage    FOOT SURGERY     removal of bunion 2011   HAND [place]     umbilical 8921/1941   HYSTEROSCOPY WITH D & C N/A 12/21/2014   Procedure: DILATATION AND CURETTAGE /HYSTEROSCOPY;  Surgeon: Honor Loh Ward, MD;  Location: [facility] OR[facility];  Service: Gynecology;  Laterality:  N/A;   KNEE ARTHROSCOPY     ACL tear 1984   TUBAL LIGATION     2005   Family History  Problem Relation Age of Onset   Breast cancer Maternal Grandmother    Brain cancer Maternal Grandmother    Cancer Maternal Grandmother        breast and brain    Breast cancer Paternal Grandmother 30   Cancer Paternal Grandmother        breast    Hypertension Mother    Other Mother        liver cysts   Brain cancer Father 83       anaplastic astrocyntoma level 3   Cancer Father        brain astrocytoma    Depression Sister    Diabetes Brother    Social History   Socioeconomic History   Marital status: Married    Spouse name: Not on file   Number of children: 3   Years of education: Not on file   Highest education level: Not on file  Occupational History   Not on file  Tobacco Use   Smoking status:  Never   Smokeless tobacco: Never  Vaping Use   Vaping Use: Never used  Substance and Sexual Activity   Alcohol use: Yes    Comment: social   Drug use: No   Sexual activity: Yes    Birth control/protection: Surgical  Other Topics Concern   Not on file  Social History Narrative   [place]    Married 3 kids, married 85 years as of 12/2017    Never smoker    No guns, safe in relationship, wears seat belt    Social Determinants of Health   Financial Resource Strain: Not on file  Food Insecurity: Not on file  Transportation Needs: Not on file  Physical Activity: Not on file  Stress: Not on file  Social Connections: Not on file  Intimate Partner Violence: Not on file   Current Meds  Medication Sig   Magnesium Hydroxide (DULCOLAX PO) Take by mouth.   Allergies  Allergen Reactions   Sulfa Antibiotics Rash   Chocolate    Citrus    Peanut-Containing Drug Products Hives   Tomato    Ibuprofen Hives   No results found for this or any previous visit (from the past 2160 hour(s)). Objective  Body mass index is 29.25 kg/m. Wt Readings from Last 3 Encounters:   12/06/20 184 lb (83.5 kg)  03/25/19 176 lb 3.2 oz (79.9 kg)  04/18/18 178 lb 12.8 oz (81.1 kg)   Temp Readings from Last 3 Encounters:  12/06/20 97.8 F (36.6 C) (Oral)  03/25/19 98.2 F (36.8 C) (Oral)  04/18/18 98.5 F (36.9 C) (Oral)   BP Readings from Last 3 Encounters:  12/06/20 110/78  03/25/19 132/82  04/18/18 130/72   Pulse Readings from Last 3 Encounters:  12/06/20 78  03/25/19 68  04/18/18 65    Physical Exam Vitals and nursing note reviewed.  Constitutional:      Appearance: Normal appearance. She is well-developed and well-groomed.  HENT:     Head: Normocephalic and atraumatic.  Eyes:     Conjunctiva/sclera: Conjunctivae normal.     Pupils: Pupils are equal, round, and reactive to light.  Cardiovascular:     Rate and Rhythm: Normal rate and regular rhythm.     Heart sounds: Normal heart sounds. No murmur heard. Pulmonary:     Effort: Pulmonary effort is normal.     Breath sounds: Normal breath sounds.  Abdominal:     Tenderness: There is no abdominal tenderness.  Skin:    General: Skin is warm and moist.  Neurological:     General: No focal deficit present.     Mental Status: She is alert and oriented to person, place, and time.     Gait: Gait normal.  Psychiatric:        Attention and Perception: Attention and perception normal.        Mood and Affect: Mood and affect normal.        Speech: Speech normal.        Behavior: Behavior is cooperative.        Thought Content: Thought content normal.        Cognition and Memory: Cognition normal.        Judgment: Judgment normal.    Assessment  Plan  Annual physical exam Had labs at work 05/2020 and 08/13/20  Last flu shot 2017, declines  Tdap utd had at work in 2020  disc shingrix It today call back if interested given info Had covid 19 02/2020 declines covid  vx had covid  Declines STD testing   Pap 2017 [facility] likely need another 2020 -saw Dr. Vikki Ports Ward when at [facility] she is agreeable  to see [facility] again will let me know when ready for referral and will check with insurance to see if will cover  -of note ECC bx 2016 negative    07/02/19 neg pap neg hpv ob/gyn Dr. Leonides Schanz   mammo neg 02/13/18 neg referred today [place]  06/08/20 negative ordered    Colonoscopy rec with FH, not good candidate  cologuard  -pt will let me know will call back about colonoscopy husband saw Dr. Jonathon Bellows she may want to see them also disc [facility], [place] and [place] she will let me   -h/o 1 brother with tubular adenoma polyps and another with colon resection for mass 1/3 intestine and appedenix    12/06/20 referred colonoscopy Dr. Jonathon Bellows  Dermatology Dr. Brendolyn Patty f/u prn as of  12/06/20 call and sch appt   [facility] Dr. Gilman Schmidt dental works   Reviewed labs 06/12/12 vit D 33.7    Declines  HIV   rec healthy diet and exercise    Chronic constipation - Plan: Ambulatory referral to Gastroenterology  Encounter for screening colonoscopy - Plan: Ambulatory referral to Gastroenterology,   FH: colon polyps - Plan: Ambulatory referral to Gastroenterology,  Atypical chest pain - Plan: Ambulatory referral to Gastroenterology consider EGD with GI  Hair loss SK (seborrheic keratosis)  Call Dr. Nicole Kindred and get appt for tbse and above     Provider: Dr. Olivia Mackie McLean-Scocuzza-Internal Medicine

## 2020-12-06 NOTE — Patient Instructions (Addendum)
Please fax 05/2020 labs from work I have the ones 08/13/20 [phone]  Call dermatology Dr. Brendolyn Patty for hair loss and get skin cancer screening  Hair skin and nail vitamins  Water   Get back with dates of Tdap vaccine   Referred to Dr. Jonathon Bellows the office will call you   Mammogram due 06/08/21   Zoster Vaccine, Recombinant injection/Shingrix consider even though had shingles   What is this medication? ZOSTER VACCINE (ZOS ter vak SEEN) is a vaccine used to reduce the risk of getting shingles. This vaccine is not used to treat shingles or nerve pain fromshingles. This medicine may be used for other purposes; ask your health care provider orpharmacist if you have questions. COMMON BRAND NAME(S): [facility] What should I tell my care team before I take this medication? They need to know if you have any of these conditions: cancer immune system problems an unusual or allergic reaction to Zoster vaccine, other medications, foods, dyes, or preservatives pregnant or trying to get pregnant breast-feeding How should I use this medication? This vaccine is injected into a muscle. It is given by a health care provider. A copy of Vaccine Information Statements will be given before each vaccination. Be sure to read this information carefully each time. This sheet may changeoften. Talk to your health care provider about the use of this vaccine in children.This vaccine is not approved for use in children. Overdosage: If you think you have taken too much of this medicine contact apoison control center or emergency room at once. NOTE: This medicine is only for you. Do not share this medicine with others. What if I miss a dose? Keep appointments for follow-up (booster) doses. It is important not to miss your dose. Call your health care provider if you are unable to keep anappointment. What may interact with this medication? medicines that suppress your immune system medicines to treat  cancer steroid medicines like prednisone or cortisone This list may not describe all possible interactions. Give your health care provider a list of all the medicines, herbs, non-prescription drugs, or dietary supplements you use. Also tell them if you smoke, drink alcohol, or use illegaldrugs. Some items may interact with your medicine. What should I watch for while using this medication? Visit your health care provider regularly. This vaccine, like all vaccines, may not fully protect everyone. What side effects may I notice from receiving this medication? Side effects that you should report to your doctor or health care professionalas soon as possible: allergic reactions (skin rash, itching or hives; swelling of the face, lips, or tongue) trouble breathing Side effects that usually do not require medical attention (report these toyour doctor or health care professional if they continue or are bothersome): chills headache fever nausea pain, redness, or irritation at site where injected tiredness vomiting This list may not describe all possible side effects. Call your doctor for medical advice about side effects. You may report side effects to FDA at1-800-FDA-1088. Where should I keep my medication? This vaccine is only given by a health care provider. It will not be stored athome. NOTE: This sheet is a summary. It may not cover all possible information. If you have questions about this medicine, talk to your doctor, pharmacist, orhealth care provider.  2022 Elsevier/Gold Standard (2019-07-17 16:23:07)

## 2020-12-12 DIAGNOSIS — Z20822 Contact with and (suspected) exposure to covid-19: Secondary | ICD-10-CM | POA: Diagnosis not present

## 2020-12-28 ENCOUNTER — Telehealth: Payer: Self-pay | Admitting: Internal Medicine

## 2020-12-28 NOTE — Telephone Encounter (Signed)
Received [facility] labs recently finally 05/31/20 labs done Uric acid 6.0 Creatinine 1.01 GFR 62 rec increase water intake 55-64 ounces  Liver kidneys normal  Cholesterol normal  Tsh normal 3.00  Blood cts normal  A1C 5.8 =prediabetes  Vitamin D 40.5

## 2020-12-29 NOTE — Telephone Encounter (Signed)
Patient informed and verbalized understanding

## 2021-01-02 DIAGNOSIS — M62830 Muscle spasm of back: Secondary | ICD-10-CM | POA: Diagnosis not present

## 2021-01-02 DIAGNOSIS — M9903 Segmental and somatic dysfunction of lumbar region: Secondary | ICD-10-CM | POA: Diagnosis not present

## 2021-01-02 DIAGNOSIS — M5416 Radiculopathy, lumbar region: Secondary | ICD-10-CM | POA: Diagnosis not present

## 2021-01-02 DIAGNOSIS — M9905 Segmental and somatic dysfunction of pelvic region: Secondary | ICD-10-CM | POA: Diagnosis not present

## 2021-01-05 DIAGNOSIS — M62830 Muscle spasm of back: Secondary | ICD-10-CM | POA: Diagnosis not present

## 2021-01-05 DIAGNOSIS — M9903 Segmental and somatic dysfunction of lumbar region: Secondary | ICD-10-CM | POA: Diagnosis not present

## 2021-01-05 DIAGNOSIS — M5416 Radiculopathy, lumbar region: Secondary | ICD-10-CM | POA: Diagnosis not present

## 2021-01-05 DIAGNOSIS — M9905 Segmental and somatic dysfunction of pelvic region: Secondary | ICD-10-CM | POA: Diagnosis not present

## 2021-01-11 DIAGNOSIS — M62830 Muscle spasm of back: Secondary | ICD-10-CM | POA: Diagnosis not present

## 2021-01-11 DIAGNOSIS — M9905 Segmental and somatic dysfunction of pelvic region: Secondary | ICD-10-CM | POA: Diagnosis not present

## 2021-01-11 DIAGNOSIS — M9903 Segmental and somatic dysfunction of lumbar region: Secondary | ICD-10-CM | POA: Diagnosis not present

## 2021-01-11 DIAGNOSIS — M5416 Radiculopathy, lumbar region: Secondary | ICD-10-CM | POA: Diagnosis not present

## 2021-01-17 DIAGNOSIS — M9905 Segmental and somatic dysfunction of pelvic region: Secondary | ICD-10-CM | POA: Diagnosis not present

## 2021-01-17 DIAGNOSIS — M9903 Segmental and somatic dysfunction of lumbar region: Secondary | ICD-10-CM | POA: Diagnosis not present

## 2021-01-17 DIAGNOSIS — M5416 Radiculopathy, lumbar region: Secondary | ICD-10-CM | POA: Diagnosis not present

## 2021-01-17 DIAGNOSIS — M62830 Muscle spasm of back: Secondary | ICD-10-CM | POA: Diagnosis not present

## 2021-01-18 ENCOUNTER — Ambulatory Visit (INDEPENDENT_AMBULATORY_CARE_PROVIDER_SITE_OTHER): Payer: BC Managed Care – PPO | Admitting: Gastroenterology

## 2021-01-18 ENCOUNTER — Other Ambulatory Visit: Payer: Self-pay

## 2021-01-18 ENCOUNTER — Encounter: Payer: Self-pay | Admitting: Gastroenterology

## 2021-01-18 VITALS — BP 146/84 | HR 67 | Temp 98.0°F | Ht 66.5 in | Wt 183.0 lb

## 2021-01-18 DIAGNOSIS — Z1211 Encounter for screening for malignant neoplasm of colon: Secondary | ICD-10-CM | POA: Diagnosis not present

## 2021-01-18 DIAGNOSIS — K5909 Other constipation: Secondary | ICD-10-CM | POA: Diagnosis not present

## 2021-01-18 MED ORDER — NA SULFATE-K SULFATE-MG SULF 17.5-3.13-1.6 GM/177ML PO SOLN: 354.0000 mL | Freq: Once | ORAL | 0 refills | Status: AC

## 2021-01-18 NOTE — Patient Instructions (Signed)
High-Fiber Eating Plan °Fiber, also called dietary fiber, is a type of carbohydrate. It is found foods such as fruits, vegetables, whole grains, and beans. A high-fiber diet can have many health benefits. Your health care provider may recommend a high-fiber diet to help: °Prevent constipation. Fiber can make your bowel movements more regular. °Lower your cholesterol. °Relieve the following conditions: °Inflammation of veins in the anus (hemorrhoids). °Inflammation of specific areas of the digestive tract (uncomplicated diverticulosis). °A problem of the large intestine, also called the colon, that sometimes causes pain and diarrhea (irritable bowel syndrome, or IBS). °Prevent overeating as part of a weight-loss plan. °Prevent heart disease, type 2 diabetes, and certain cancers. °What are tips for following this plan? °Reading food labels ° °Check the nutrition facts label on food products for the amount of dietary fiber. Choose foods that have 5 grams of fiber or more per serving. °The goals for recommended daily fiber intake include: °Men (age 50 or younger): 34-38 g. °Men (over age 50): 28-34 g. °Women (age 50 or younger): 25-28 g. °Women (over age 50): 22-25 g. °Your daily fiber goal is _____________ g. °Shopping °Choose whole fruits and vegetables instead of processed forms, such as apple juice or applesauce. °Choose a wide variety of high-fiber foods such as avocados, lentils, oats, and kidney beans. °Read the nutrition facts label of the foods you choose. Be aware of foods with added fiber. These foods often have high sugar and sodium amounts per serving. °Cooking °Use whole-grain flour for baking and cooking. °Cook with brown rice instead of white rice. °Meal planning °Start the day with a breakfast that is high in fiber, such as a cereal that contains 5 g of fiber or more per serving. °Eat breads and cereals that are made with whole-grain flour instead of refined flour or white flour. °Eat brown rice, bulgur  wheat, or millet instead of white rice. °Use beans in place of meat in soups, salads, and pasta dishes. °Be sure that half of the grains you eat each day are whole grains. °General information °You can get the recommended daily intake of dietary fiber by: °Eating a variety of fruits, vegetables, grains, nuts, and beans. °Taking a fiber supplement if you are not able to take in enough fiber in your diet. It is better to get fiber through food than from a supplement. °Gradually increase how much fiber you consume. If you increase your intake of dietary fiber too quickly, you may have bloating, cramping, or gas. °Drink plenty of water to help you digest fiber. °Choose high-fiber snacks, such as berries, raw vegetables, nuts, and popcorn. °What foods should I eat? °Fruits °Berries. Pears. Apples. Oranges. Avocado. Prunes and raisins. Dried figs. °Vegetables °Sweet potatoes. Spinach. Kale. Artichokes. Cabbage. Broccoli. Cauliflower. Green peas. Carrots. Squash. °Grains °Whole-grain breads. Multigrain cereal. Oats and oatmeal. Brown rice. Barley. Bulgur wheat. Millet. Quinoa. Bran muffins. Popcorn. Rye wafer crackers. °Meats and other proteins °Navy beans, kidney beans, and pinto beans. Soybeans. Split peas. Lentils. Nuts and seeds. °Dairy °Fiber-fortified yogurt. °Beverages °Fiber-fortified soy milk. Fiber-fortified orange juice. °Other foods °Fiber bars. °The items listed above may not be a complete list of recommended foods and beverages. Contact a dietitian for more information. °What foods should I avoid? °Fruits °Fruit juice. Cooked, strained fruit. °Vegetables °Fried potatoes. Canned vegetables. Well-cooked vegetables. °Grains °White bread. Pasta made with refined flour. White rice. °Meats and other proteins °Fatty cuts of meat. Fried chicken or fried fish. °Dairy °Milk. Yogurt. Cream cheese. Sour cream. °Fats and   oils °Butters. °Beverages °Soft drinks. °Other foods °Cakes and pastries. °The items listed above may  not be a complete list of foods and beverages to avoid. Talk with your dietitian about what choices are best for you. °Summary °Fiber is a type of carbohydrate. It is found in foods such as fruits, vegetables, whole grains, and beans. °A high-fiber diet has many benefits. It can help to prevent constipation, lower blood cholesterol, aid weight loss, and reduce your risk of heart disease, diabetes, and certain cancers. °Increase your intake of fiber gradually. Increasing fiber too quickly may cause cramping, bloating, and gas. Drink plenty of water while you increase the amount of fiber you consume. °The best sources of fiber include whole fruits and vegetables, whole grains, nuts, seeds, and beans. °This information is not intended to replace advice given to you by your health care provider. Make sure you discuss any questions you have with your health care provider. °Document Revised: 10/15/2019 Document Reviewed: 10/15/2019 °Elsevier Patient Education © 2022 Elsevier Inc. ° °

## 2021-01-18 NOTE — Progress Notes (Signed)
Jonathon Bellows MD, MRCP(U.K) [address]  [place]  [place], [place] [address]  Main: [phone]  Fax: [phone]   Gastroenterology Consultation  Referring Provider:     McLean-Scocuzza, Olivia Mackie * Primary Care Physician:  McLean-Scocuzza, Nino Glow, MD Primary Gastroenterologist:  Dr. Jonathon Bellows  Reason for Consultation:     Chronic constipation and colon cancer screening        HPI:   Karen Chapman is a 60 y.o. y/o female referred for consultation & management  by Dr. Terese Door, Nino Glow, MD.    She states that since [facility] Day she has had irregular bowel habits.  Previously she would have a bowel movement every 2 to 3 days.  Denies it being too hard but she does state that she does not have a good bowel movement has abdominal distention and bloating.  After [facility] Day did not have a bowel movement for a week then took some Dulcolax and had watery stools.  Subsequently her bowel movements have returned back to her baseline.  I reviewed her diet.  He does have some fiber in the form of berries with her breakfast but does not have any significant quantity of fiber.  No prior colonoscopy.  No change in shape of stool.  No rectal bleeding.  Denies any weight loss.  04/20/2021: CT scan of the abdomen and pelvis showed polycystic liver February 2022 TSH normal Past Medical History:  Diagnosis Date   Abnormal Pap smear of cervix 2002   had colpo and freezing and all neg since then   COVID-19    02/2020   Endometrial polyp    Folliculitis    buttocks Dr. Nicole Kindred   History of chicken pox    History of mammogram 05/08/2011   cat 1   History of Papanicolaou smear of cervix 03/14/2011   -/-   Irregular bleeding    Kidney stone on left side    Liver cyst    Shingles    2018 right abdomen    Past Surgical History:  Procedure Laterality Date   CERVICAL POLYPECTOMY     2016   [place]  2002   nl paps since    CRYOTHERAPY  2002    [place] 2/2 miscarriage    FOOT SURGERY     removal of bunion 2011   HAND [place]     umbilical [id number]   HYSTEROSCOPY WITH D & C N/A 12/21/2014   Procedure: DILATATION AND CURETTAGE /HYSTEROSCOPY;  Surgeon: Honor Loh Ward, MD;  Location: [facility] ORS;  Service: Gynecology;  Laterality: N/A;   KNEE ARTHROSCOPY     ACL tear 1984   TUBAL LIGATION     2005    Prior to Admission medications   Medication Sig Start Date End Date Taking? Authorizing Provider  Magnesium Hydroxide (DULCOLAX PO) Take by mouth.    [provider]  Multiple Vitamin (MULTIVITAMIN) tablet Take 1 tablet by mouth daily.    [provider]  Vitamin D, Cholecalciferol, 1000 units CAPS Take 1 capsule by mouth daily.    [provider]    Family History  Problem Relation Age of Onset   Breast cancer Maternal Grandmother    Brain cancer Maternal Grandmother    Cancer Maternal Grandmother        breast and brain  Breast cancer Paternal Grandmother 33   Cancer Paternal Grandmother        breast    Hypertension Mother    Other Mother        liver cysts   Brain cancer Father 66       anaplastic astrocyntoma level 3   Cancer Father        brain astrocytoma    Depression Sister    Diabetes Brother      Social History   Tobacco Use   Smoking status: Never   Smokeless tobacco: Never  Vaping Use   Vaping Use: Never used  Substance Use Topics   Alcohol use: Yes    Comment: social   Drug use: No    Allergies as of 01/18/2021 - Review Complete 12/06/2020  Allergen Reaction Noted   Sulfa antibiotics Rash 12/13/2014   Chocolate  12/20/2014   Citrus  12/20/2014   Peanut-containing drug products Hives 12/13/2014   Tomato  12/20/2014   Ibuprofen Hives 12/13/2014    Review of Systems:    All systems reviewed and negative except where noted in HPI.   Physical Exam:  LMP 04/29/2016  Patient's last menstrual period  was 04/29/2016. Psych:  Alert and cooperative. Normal mood and affect. General:   Alert,  Well-developed, well-nourished, pleasant and cooperative in NAD Head:  Normocephalic and atraumatic. Eyes:  Sclera clear, no icterus.   Conjunctiva pink. Ears:  Normal auditory acuity. Lungs:  Respirations even and unlabored.  Clear throughout to auscultation.   No wheezes, crackles, or rhonchi. No acute distress. Heart:  Regular rate and rhythm; no murmurs, clicks, rubs, or gallops. Abdomen:  Normal bowel sounds.  No bruits.  Soft, non-tender and non-distended without masses, hepatosplenomegaly or hernias noted.  No guarding or rebound tenderness.    Neurologic:  Alert and oriented x3;  grossly normal neurologically. Psych:  Alert and cooperative. Normal mood and affect.  Imaging Studies: No results found.  Assessment and Plan:   Karen Chapman is a 59 y.o. y/o female has been referred for chronic constipation and colon cancer screening  Plan 1.  High-fiber diet counseled on increasing dietary fiber to 25 g/day.  If does not work advised to commence on MiraLAX half a cap to 1 cap daily.  If no better at next visit will commence on either Amitiza or Linzess. 2.  Screening colonoscopy  I have discussed alternative options, risks & benefits,  which include, but are not limited to, bleeding, infection, perforation,respiratory complication & drug reaction.  The patient agrees with this plan & written consent will be obtained.     Follow up in 8-12 weeks  Dr Jonathon Bellows MD,MRCP(U.K)

## 2021-01-24 DIAGNOSIS — M5416 Radiculopathy, lumbar region: Secondary | ICD-10-CM | POA: Diagnosis not present

## 2021-01-24 DIAGNOSIS — M9903 Segmental and somatic dysfunction of lumbar region: Secondary | ICD-10-CM | POA: Diagnosis not present

## 2021-01-24 DIAGNOSIS — M62830 Muscle spasm of back: Secondary | ICD-10-CM | POA: Diagnosis not present

## 2021-01-24 DIAGNOSIS — M9905 Segmental and somatic dysfunction of pelvic region: Secondary | ICD-10-CM | POA: Diagnosis not present

## 2021-01-31 ENCOUNTER — Ambulatory Visit: Payer: BC Managed Care – PPO | Admitting: Gastroenterology

## 2021-02-01 DIAGNOSIS — M62830 Muscle spasm of back: Secondary | ICD-10-CM | POA: Diagnosis not present

## 2021-02-01 DIAGNOSIS — M9903 Segmental and somatic dysfunction of lumbar region: Secondary | ICD-10-CM | POA: Diagnosis not present

## 2021-02-01 DIAGNOSIS — M9905 Segmental and somatic dysfunction of pelvic region: Secondary | ICD-10-CM | POA: Diagnosis not present

## 2021-02-01 DIAGNOSIS — M5416 Radiculopathy, lumbar region: Secondary | ICD-10-CM | POA: Diagnosis not present

## 2021-02-08 DIAGNOSIS — M62830 Muscle spasm of back: Secondary | ICD-10-CM | POA: Diagnosis not present

## 2021-02-08 DIAGNOSIS — M9903 Segmental and somatic dysfunction of lumbar region: Secondary | ICD-10-CM | POA: Diagnosis not present

## 2021-02-08 DIAGNOSIS — M5416 Radiculopathy, lumbar region: Secondary | ICD-10-CM | POA: Diagnosis not present

## 2021-02-08 DIAGNOSIS — M9905 Segmental and somatic dysfunction of pelvic region: Secondary | ICD-10-CM | POA: Diagnosis not present

## 2021-02-10 ENCOUNTER — Encounter: Payer: Self-pay | Admitting: Internal Medicine

## 2021-02-22 DIAGNOSIS — M9903 Segmental and somatic dysfunction of lumbar region: Secondary | ICD-10-CM | POA: Diagnosis not present

## 2021-02-22 DIAGNOSIS — M62830 Muscle spasm of back: Secondary | ICD-10-CM | POA: Diagnosis not present

## 2021-02-22 DIAGNOSIS — M9905 Segmental and somatic dysfunction of pelvic region: Secondary | ICD-10-CM | POA: Diagnosis not present

## 2021-02-22 DIAGNOSIS — M5416 Radiculopathy, lumbar region: Secondary | ICD-10-CM | POA: Diagnosis not present

## 2021-03-03 ENCOUNTER — Encounter: Payer: Self-pay | Admitting: Gastroenterology

## 2021-03-06 ENCOUNTER — Encounter
Admission: RE | Disposition: A | Discharge status: Home / Self Care | Payer: Self-pay | Source: Home / Self Care | Attending: Gastroenterology

## 2021-03-06 ENCOUNTER — Ambulatory Visit
Admission: RE | Admit: 2021-03-06 | Discharge: 2021-03-06 | Disposition: A | Discharge status: Home / Self Care | Payer: BC Managed Care – PPO | Attending: Gastroenterology | Admitting: Gastroenterology

## 2021-03-06 ENCOUNTER — Ambulatory Visit: Payer: BC Managed Care – PPO | Admitting: Anesthesiology

## 2021-03-06 ENCOUNTER — Encounter: Payer: Self-pay | Admitting: Gastroenterology

## 2021-03-06 DIAGNOSIS — Z8379 Family history of other diseases of the digestive system: Secondary | ICD-10-CM | POA: Diagnosis not present

## 2021-03-06 DIAGNOSIS — Z8616 Personal history of COVID-19: Secondary | ICD-10-CM | POA: Diagnosis not present

## 2021-03-06 DIAGNOSIS — Z8619 Personal history of other infectious and parasitic diseases: Secondary | ICD-10-CM | POA: Insufficient documentation

## 2021-03-06 DIAGNOSIS — Z9851 Tubal ligation status: Secondary | ICD-10-CM | POA: Diagnosis not present

## 2021-03-06 DIAGNOSIS — Z882 Allergy status to sulfonamides status: Secondary | ICD-10-CM | POA: Diagnosis not present

## 2021-03-06 DIAGNOSIS — Z98891 History of uterine scar from previous surgery: Secondary | ICD-10-CM | POA: Diagnosis not present

## 2021-03-06 DIAGNOSIS — K635 Polyp of colon: Secondary | ICD-10-CM | POA: Diagnosis not present

## 2021-03-06 DIAGNOSIS — D122 Benign neoplasm of ascending colon: Secondary | ICD-10-CM | POA: Insufficient documentation

## 2021-03-06 DIAGNOSIS — Z87442 Personal history of urinary calculi: Secondary | ICD-10-CM | POA: Insufficient documentation

## 2021-03-06 DIAGNOSIS — Z1211 Encounter for screening for malignant neoplasm of colon: Secondary | ICD-10-CM | POA: Insufficient documentation

## 2021-03-06 DIAGNOSIS — Z886 Allergy status to analgesic agent status: Secondary | ICD-10-CM | POA: Insufficient documentation

## 2021-03-06 HISTORY — PX: COLONOSCOPY WITH PROPOFOL: SHX5780

## 2021-03-06 HISTORY — DX: Personal history of urinary calculi: Z87.442

## 2021-03-06 SURGERY — COLONOSCOPY WITH PROPOFOL
Anesthesia: General

## 2021-03-06 MED ORDER — MIDAZOLAM HCL 2 MG/2ML IJ SOLN
INTRAMUSCULAR | Status: AC
  Filled 2021-03-06: qty 2

## 2021-03-06 MED ORDER — FENTANYL CITRATE (PF) 100 MCG/2ML IJ SOLN
INTRAMUSCULAR | Status: DC | PRN
  Administered 2021-03-06 (×4): 25 ug via INTRAVENOUS

## 2021-03-06 MED ORDER — PROPOFOL 10 MG/ML IV BOLUS
INTRAVENOUS | Status: DC | PRN
  Administered 2021-03-06: 30 mg via INTRAVENOUS

## 2021-03-06 MED ORDER — FENTANYL CITRATE (PF) 100 MCG/2ML IJ SOLN
INTRAMUSCULAR | Status: AC
  Filled 2021-03-06: qty 2

## 2021-03-06 MED ORDER — PROPOFOL 500 MG/50ML IV EMUL
INTRAVENOUS | Status: AC
  Filled 2021-03-06: qty 50

## 2021-03-06 MED ORDER — PROPOFOL 500 MG/50ML IV EMUL
INTRAVENOUS | Status: DC | PRN
  Administered 2021-03-06: 50 ug/kg/min via INTRAVENOUS

## 2021-03-06 MED ORDER — SODIUM CHLORIDE 0.9 % IV SOLN: INTRAVENOUS | Status: DC

## 2021-03-06 MED ORDER — LIDOCAINE HCL (CARDIAC) PF 100 MG/5ML IV SOSY
PREFILLED_SYRINGE | INTRAVENOUS | Status: DC | PRN
  Administered 2021-03-06: 50 mg via INTRAVENOUS

## 2021-03-06 MED ORDER — MIDAZOLAM HCL 2 MG/2ML IJ SOLN
INTRAMUSCULAR | Status: DC | PRN
  Administered 2021-03-06: 2 mg via INTRAVENOUS

## 2021-03-06 NOTE — H&P (Signed)
Karen Bellows, MD [address], [place], [place], [place], [address] [address], [place], [place], [place], [address] Phone: [phone]  Fax: [phone]  Primary Care Physician:  McLean-Scocuzza, Nino Glow, MD   Pre-Procedure History & Physical: HPI:  Karen Chapman is a 60 y.o. female is here for an colonoscopy.   Past Medical History:  Diagnosis Date   Abnormal Pap smear of cervix 2002   had colpo and freezing and all neg since then   COVID-19    02/2020   Endometrial polyp    Folliculitis    buttocks Dr. Nicole Kindred   History of chicken pox    History of kidney stones    History of mammogram 05/08/2011   cat 1   History of Papanicolaou smear of cervix 03/14/2011   -/-   Irregular bleeding    Kidney stone on left side    Liver cyst    Shingles    2018 right abdomen    Past Surgical History:  Procedure Laterality Date   CERVICAL POLYPECTOMY     2016   [place]  2002   nl paps since    CRYOTHERAPY  2002   [place] 2/2 miscarriage    FOOT SURGERY     removal of bunion 2011   HAND [place]     umbilical [id number]   HYSTEROSCOPY WITH D & C N/A 12/21/2014   Procedure: DILATATION AND CURETTAGE /HYSTEROSCOPY;  Surgeon: Honor Loh Ward, MD;  Location: [facility] ORS;  Service: Gynecology;  Laterality: N/A;   KNEE ARTHROSCOPY     ACL tear 1984   TUBAL LIGATION     2005    Prior to Admission medications   Medication Sig Start Date End Date Taking? Authorizing Provider  Magnesium Hydroxide (DULCOLAX PO) Take by mouth. Patient not taking: Reported on 01/18/2021    [provider]  Multiple Vitamin (MULTIVITAMIN) tablet Take 1 tablet by mouth daily.    [provider]  Vitamin D, Cholecalciferol, 1000 units CAPS Take 1 capsule by mouth daily.    [provider]    Allergies as of 01/18/2021 - Review Complete 01/18/2021  Allergen Reaction Noted    Sulfa antibiotics Rash 12/13/2014   Chocolate  12/20/2014   Citrus  12/20/2014   Peanut-containing drug products Hives 12/13/2014   Tomato  12/20/2014   Ibuprofen Hives 12/13/2014    Family History  Problem Relation Age of Onset   Breast cancer Maternal Grandmother    Brain cancer Maternal Grandmother    Cancer Maternal Grandmother        breast and brain    Breast cancer Paternal Grandmother 63   Cancer Paternal Grandmother        breast    Hypertension Mother    Other Mother        liver cysts   Brain cancer Father 68       anaplastic astrocyntoma level 3   Cancer Father        brain astrocytoma    Depression Sister    Diabetes Brother     Social History   Socioeconomic History   Marital status: Married    Spouse name: Not on file   Number of children: 3   Years of education: Not on file   Highest education level: Not on file  Occupational History   Not on file  Tobacco Use   Smoking status: Never   Smokeless tobacco: Never  Vaping Use   Vaping Use: Never used  Substance and Sexual Activity   Alcohol use: Yes    Comment: social   Drug use: No   Sexual activity: Yes    Birth control/protection: Surgical  Other Topics Concern   Not on file  Social History Narrative   [place]    Married 3 kids, married 30 years as of 12/2017    Never smoker    No guns, safe in relationship, wears seat belt    Social Determinants of Health   Financial Resource Strain: Not on file  Food Insecurity: Not on file  Transportation Needs: Not on file  Physical Activity: Not on file  Stress: Not on file  Social Connections: Not on file  Intimate Partner Violence: Not on file    Review of Systems: See HPI, otherwise negative ROS  Physical Exam: BP 140/82   Pulse 85   Temp (!) 97.3 F (36.3 C) (Temporal)   Resp 18   Ht 5' 6.5" (1.689 m)   Wt 81.6 kg   LMP 04/29/2016   SpO2 100%   BMI 28.62 kg/m  General:   Alert,  pleasant and cooperative in  NAD Head:  Normocephalic and atraumatic. Neck:  Supple; no masses or thyromegaly. Lungs:  Clear throughout to auscultation, normal respiratory effort.    Heart:  +S1, +S2, Regular rate and rhythm, No edema. Abdomen:  Soft, nontender and nondistended. Normal bowel sounds, without guarding, and without rebound.   Neurologic:  Alert and  oriented x4;  grossly normal neurologically.  Impression/Plan: Karen Chapman is here for an colonoscopy to be performed for Screening colonoscopy average risk   Risks, benefits, limitations, and alternatives regarding  colonoscopy have been reviewed with the patient.  Questions have been answered.  All parties agreeable.   Karen Bellows, MD  03/06/2021, 9:37 AM

## 2021-03-06 NOTE — Anesthesia Postprocedure Evaluation (Signed)
Anesthesia Post Note  Patient: Karen Chapman  Procedure(s) Performed: COLONOSCOPY WITH PROPOFOL  Patient location during evaluation: PACU Anesthesia Type: General Level of consciousness: awake and alert Pain management: pain level controlled Vital Signs Assessment: post-procedure vital signs reviewed and stable Respiratory status: spontaneous breathing, nonlabored ventilation, respiratory function stable and patient connected to nasal cannula oxygen Cardiovascular status: blood pressure returned to baseline and stable Postop Assessment: no apparent nausea or vomiting Anesthetic complications: no   No notable events documented.   Last Vitals:  Vitals:   03/06/21 1009 03/06/21 1019  BP: 100/73 119/77  Pulse: 70 76  Resp: 15 19  Temp: (!) 35.7 C   SpO2: 98% 99%    Last Pain:  Vitals:   03/06/21 1019  TempSrc:   PainSc: 0-No pain                 Niharika Savino Doyne Keel

## 2021-03-06 NOTE — Op Note (Signed)
[facility] Gastroenterology Patient Name: Karen Chapman Procedure Date: 03/06/2021 9:38 AM MRN: [id number] Account #: [id number] Date of Birth: 1960-10-11 Admit Type: Outpatient Age: 60 Room: [facility] ENDO ROOM 4 Gender: Female Note Status: Finalized Instrument Name: Jasper Riling [id number] Procedure:             Colonoscopy Indications:           Screening for colorectal malignant neoplasm Providers:             Jonathon Bellows MD, MD Referring MD:          Nino Glow Mclean-Scocuzza MD, MD (Referring MD) Medicines:             Monitored Anesthesia Care Complications:         No immediate complications. Procedure:             Pre-Anesthesia Assessment:                        - Prior to the procedure, a History and Physical was                         performed, and patient medications, allergies and                         sensitivities were reviewed. The patient's tolerance                         of previous anesthesia was reviewed.                        - The risks and benefits of the procedure and the                         sedation options and risks were discussed with the                         patient. All questions were answered and informed                         consent was obtained.                        - ASA Grade Assessment: II - A patient with mild                         systemic disease.                        After obtaining informed consent, the colonoscope was                         passed under direct vision. Throughout the procedure,                         the patient's blood pressure, pulse, and oxygen                         saturations were monitored continuously. The                         Colonoscope  was introduced through the anus and                         advanced to the the cecum, identified by the                         appendiceal orifice. The colonoscopy was performed                         with ease. The patient tolerated the  procedure well.                         The quality of the bowel preparation was excellent. Findings:      The perianal and digital rectal examinations were normal.      A 5 mm polyp was found in the ascending colon. The polyp was sessile.       The polyp was removed with a jumbo cold forceps. Resection and retrieval       were complete.      A 7 mm polyp was found in the ascending colon. The polyp was sessile.       The polyp was removed with a cold snare. Resection and retrieval were       complete.      The exam was otherwise without abnormality on direct and retroflexion       views. Impression:            - One 5 mm polyp in the ascending colon, removed with                         a jumbo cold forceps. Resected and retrieved.                        - One 7 mm polyp in the ascending colon, removed with                         a cold snare. Resected and retrieved.                        - The examination was otherwise normal on direct and                         retroflexion views. Recommendation:        - Discharge patient to home (with escort).                        - Resume previous diet.                        - Continue present medications.                        - Await pathology results.                        - Repeat colonoscopy for surveillance based on                         pathology results. Procedure Code(s):     --- Professional ---  45385, Colonoscopy, flexible; with removal of                         tumor(s), polyp(s), or other lesion(s) by snare                         technique                        45380, 70, Colonoscopy, flexible; with biopsy, single                         or multiple Diagnosis Code(s):     --- Professional ---                        Z12.11, Encounter for screening for malignant neoplasm                         of colon                        K63.5, Polyp of colon CPT copyright 2019 American Medical Association.  All rights reserved. The codes documented in this report are preliminary and upon coder review may  be revised to meet current compliance requirements. Jonathon Bellows, MD Jonathon Bellows MD, MD 03/06/2021 10:08:27 AM This report has been signed electronically. Number of Addenda: 0 Note Initiated On: 03/06/2021 9:38 AM Scope Withdrawal Time: 0 hours 10 minutes 44 seconds  Total Procedure Duration: 0 hours 15 minutes 14 seconds  Estimated Blood Loss:  Estimated blood loss: none.      [facility]

## 2021-03-06 NOTE — Anesthesia Preprocedure Evaluation (Signed)
Anesthesia Evaluation  Patient identified by MRN, date of birth, ID band Patient awake    Reviewed: Allergy & Precautions, NPO status , Patient's Chart, lab work & pertinent test results  History of Anesthesia Complications Negative for: history of anesthetic complications  Airway Mallampati: I  TM Distance: >3 FB     Dental no notable dental hx.    Pulmonary    Pulmonary exam normal        Cardiovascular Exercise Tolerance: Good Normal cardiovascular examI     Neuro/Psych    GI/Hepatic   Endo/Other    Renal/GU      Musculoskeletal   Abdominal Normal abdominal exam  (+)   Peds  Hematology   Anesthesia Other Findings   Reproductive/Obstetrics                             Anesthesia Physical Anesthesia Plan  ASA: 2  Anesthesia Plan: General   Post-op Pain Management:    Induction:   PONV Risk Score and Plan:   Airway Management Planned: Natural Airway and Simple Face Mask  Additional Equipment: None  Intra-op Plan:   Post-operative Plan:   Informed Consent: I have reviewed the patients History and Physical, chart, labs and discussed the procedure including the risks, benefits and alternatives for the proposed anesthesia with the patient or authorized representative who has indicated his/her understanding and acceptance.       Plan Discussed with: CRNA  Anesthesia Plan Comments:         Anesthesia Quick Evaluation

## 2021-03-06 NOTE — Transfer of Care (Signed)
Immediate Anesthesia Transfer of Care Note  Patient: Karen Chapman  Procedure(s) Performed: COLONOSCOPY WITH PROPOFOL  Patient Location: PACU  Anesthesia Type:General  Level of Consciousness: sedated  Airway & Oxygen Therapy: Patient Spontanous Breathing and Patient connected to nasal cannula oxygen  Post-op Assessment: Report given to RN and Post -op Vital signs reviewed and stable  Post vital signs: Reviewed and stable  Last Vitals:  Vitals Value Taken Time  BP 100/73 03/06/21 1009  Temp 35.7 C 03/06/21 1009  Pulse 71 03/06/21 1009  Resp 15 03/06/21 1009  SpO2 99 % 03/06/21 1009  Vitals shown include unvalidated device data.  Last Pain:  Vitals:   03/06/21 1009  TempSrc: Tympanic  PainSc: Asleep         Complications: No notable events documented.

## 2021-03-07 ENCOUNTER — Encounter: Payer: Self-pay | Admitting: Gastroenterology

## 2021-03-07 LAB — SURGICAL PATHOLOGY

## 2021-03-15 DIAGNOSIS — M9903 Segmental and somatic dysfunction of lumbar region: Secondary | ICD-10-CM | POA: Diagnosis not present

## 2021-03-15 DIAGNOSIS — M9905 Segmental and somatic dysfunction of pelvic region: Secondary | ICD-10-CM | POA: Diagnosis not present

## 2021-03-15 DIAGNOSIS — M62830 Muscle spasm of back: Secondary | ICD-10-CM | POA: Diagnosis not present

## 2021-03-15 DIAGNOSIS — M5416 Radiculopathy, lumbar region: Secondary | ICD-10-CM | POA: Diagnosis not present

## 2021-04-05 DIAGNOSIS — M9905 Segmental and somatic dysfunction of pelvic region: Secondary | ICD-10-CM | POA: Diagnosis not present

## 2021-04-05 DIAGNOSIS — M9903 Segmental and somatic dysfunction of lumbar region: Secondary | ICD-10-CM | POA: Diagnosis not present

## 2021-04-05 DIAGNOSIS — M62830 Muscle spasm of back: Secondary | ICD-10-CM | POA: Diagnosis not present

## 2021-04-05 DIAGNOSIS — M5416 Radiculopathy, lumbar region: Secondary | ICD-10-CM | POA: Diagnosis not present

## 2021-04-20 ENCOUNTER — Encounter: Payer: Self-pay | Admitting: Gastroenterology

## 2021-04-20 ENCOUNTER — Other Ambulatory Visit: Payer: Self-pay

## 2021-04-20 ENCOUNTER — Ambulatory Visit (INDEPENDENT_AMBULATORY_CARE_PROVIDER_SITE_OTHER): Payer: BC Managed Care – PPO | Admitting: Gastroenterology

## 2021-04-20 VITALS — BP 132/83 | HR 75 | Temp 98.1°F | Ht 66.5 in | Wt 186.4 lb

## 2021-04-20 DIAGNOSIS — K5909 Other constipation: Secondary | ICD-10-CM | POA: Diagnosis not present

## 2021-04-20 NOTE — Progress Notes (Signed)
    Jonathon Bellows MD, MRCP(U.K) [address]  [place]  [place], [place] [address]  Main: [phone]  Fax: [phone]   Primary Care Physician: McLean-Scocuzza, Nino Glow, MD  Primary Gastroenterologist:  Dr. Jonathon Bellows   Chief complaint: Follow-up for chronic constipation   HPI: Karen Chapman is a 60 y.o. female   Summary of history :  Patient was initially seen and referred on 01/18/2021 for chronic constipation.  Would not have a bowel movement for at least 2 to 3 days and was very hard.  Not much fiber in the diet.  Commenced on MiraLAX 1 cap daily and advised to increase dietary fiber intake to 25 g of fiber per day  Interval history   01/18/2021-04/20/2021  03/06/2021: Colonoscopy: 2 polyps from 5 to 7 mm in size in the ascending colon resected.  Pathology demonstrated sessile serrated polyp. After her last visit she increased her dietary fiber to 25 g of fiber per day and since then noted regular bowel movements and did not have to take any MiraLAX.  Prior to her visit she feels she was taking it only about 4 g of fiber per day   Current Outpatient Medications  Medication Sig Dispense Refill   Magnesium Hydroxide (DULCOLAX PO) Take by mouth. (Patient not taking: Reported on 01/18/2021)     Multiple Vitamin (MULTIVITAMIN) tablet Take 1 tablet by mouth daily.     Vitamin D, Cholecalciferol, 1000 units CAPS Take 1 capsule by mouth daily.     No current facility-administered medications for this visit.    Allergies as of 04/20/2021 - Review Complete 03/06/2021  Allergen Reaction Noted   Sulfa antibiotics Rash 12/13/2014   Chocolate  12/20/2014   Citrus  12/20/2014   Peanut-containing drug products Hives 12/13/2014   Tomato  12/20/2014   Ibuprofen Hives 12/13/2014    ROS:  General: Negative for anorexia, weight loss, fever, chills, fatigue, weakness. ENT: Negative for hoarseness, difficulty swallowing , nasal congestion. CV: Negative for chest pain, angina,  palpitations, dyspnea on exertion, peripheral edema.  Respiratory: Negative for dyspnea at rest, dyspnea on exertion, cough, sputum, wheezing.  GI: See history of present illness. GU:  Negative for dysuria, hematuria, urinary incontinence, urinary frequency, nocturnal urination.  Endo: Negative for unusual weight change.    Physical Examination:   LMP 04/29/2016   General: Well-nourished, well-developed in no acute distress.  Eyes: No icterus. Conjunctivae pink. Neuro: Alert and oriented x 3.  Grossly intact. Skin: Warm and dry, no jaundice.   Psych: Alert and cooperative, normal mood and affect.   Imaging Studies: No results found.  Assessment and Plan:   Karen Chapman is a 60 y.o. y/o female here to follow-up for chronic constipation.  2 sessile polyps resected on last colonoscopy sessile serrated adenomas.  Chronic constipation has responded to high-fiber diet of 25 g/day.Marland Kitchen  She will continue to do the same and if needed she can take MiraLAX as needed.  No other issues today.  Plan 1.   Dr Jonathon Bellows  MD,MRCP [facility]) Follow up in follow-up as needed

## 2021-04-26 DIAGNOSIS — M9903 Segmental and somatic dysfunction of lumbar region: Secondary | ICD-10-CM | POA: Diagnosis not present

## 2021-04-26 DIAGNOSIS — M9905 Segmental and somatic dysfunction of pelvic region: Secondary | ICD-10-CM | POA: Diagnosis not present

## 2021-04-26 DIAGNOSIS — M5416 Radiculopathy, lumbar region: Secondary | ICD-10-CM | POA: Diagnosis not present

## 2021-04-26 DIAGNOSIS — M62830 Muscle spasm of back: Secondary | ICD-10-CM | POA: Diagnosis not present

## 2021-06-08 DIAGNOSIS — Z0189 Encounter for other specified special examinations: Secondary | ICD-10-CM | POA: Diagnosis not present

## 2021-06-13 DIAGNOSIS — Z713 Dietary counseling and surveillance: Secondary | ICD-10-CM | POA: Diagnosis not present

## 2021-06-13 DIAGNOSIS — R7303 Prediabetes: Secondary | ICD-10-CM | POA: Diagnosis not present

## 2021-06-13 DIAGNOSIS — Z043 Encounter for examination and observation following other accident: Secondary | ICD-10-CM | POA: Diagnosis not present

## 2021-06-30 DIAGNOSIS — U071 COVID-19: Secondary | ICD-10-CM | POA: Diagnosis not present

## 2021-07-06 DIAGNOSIS — M5416 Radiculopathy, lumbar region: Secondary | ICD-10-CM | POA: Diagnosis not present

## 2021-07-06 DIAGNOSIS — M9905 Segmental and somatic dysfunction of pelvic region: Secondary | ICD-10-CM | POA: Diagnosis not present

## 2021-07-06 DIAGNOSIS — M62830 Muscle spasm of back: Secondary | ICD-10-CM | POA: Diagnosis not present

## 2021-07-06 DIAGNOSIS — M9903 Segmental and somatic dysfunction of lumbar region: Secondary | ICD-10-CM | POA: Diagnosis not present

## 2021-07-28 DIAGNOSIS — D2262 Melanocytic nevi of left upper limb, including shoulder: Secondary | ICD-10-CM | POA: Diagnosis not present

## 2021-07-28 DIAGNOSIS — L281 Prurigo nodularis: Secondary | ICD-10-CM | POA: Diagnosis not present

## 2021-07-28 DIAGNOSIS — D225 Melanocytic nevi of trunk: Secondary | ICD-10-CM | POA: Diagnosis not present

## 2021-07-28 DIAGNOSIS — D2272 Melanocytic nevi of left lower limb, including hip: Secondary | ICD-10-CM | POA: Diagnosis not present

## 2021-08-10 DIAGNOSIS — M9905 Segmental and somatic dysfunction of pelvic region: Secondary | ICD-10-CM | POA: Diagnosis not present

## 2021-08-10 DIAGNOSIS — M5416 Radiculopathy, lumbar region: Secondary | ICD-10-CM | POA: Diagnosis not present

## 2021-08-10 DIAGNOSIS — M9903 Segmental and somatic dysfunction of lumbar region: Secondary | ICD-10-CM | POA: Diagnosis not present

## 2021-08-10 DIAGNOSIS — M62830 Muscle spasm of back: Secondary | ICD-10-CM | POA: Diagnosis not present

## 2021-08-15 ENCOUNTER — Ambulatory Visit: Payer: BC Managed Care – PPO | Admitting: Dermatology

## 2021-09-14 DIAGNOSIS — M62830 Muscle spasm of back: Secondary | ICD-10-CM | POA: Diagnosis not present

## 2021-09-14 DIAGNOSIS — M9905 Segmental and somatic dysfunction of pelvic region: Secondary | ICD-10-CM | POA: Diagnosis not present

## 2021-09-14 DIAGNOSIS — M9903 Segmental and somatic dysfunction of lumbar region: Secondary | ICD-10-CM | POA: Diagnosis not present

## 2021-09-14 DIAGNOSIS — M5416 Radiculopathy, lumbar region: Secondary | ICD-10-CM | POA: Diagnosis not present

## 2021-09-19 ENCOUNTER — Ambulatory Visit
Admission: RE | Admit: 2021-09-19 | Discharge: 2021-09-19 | Disposition: A | Discharge status: Home / Self Care | Payer: BC Managed Care – PPO | Source: Ambulatory Visit | Attending: Internal Medicine | Admitting: Internal Medicine

## 2021-09-19 ENCOUNTER — Other Ambulatory Visit: Payer: Self-pay

## 2021-09-19 DIAGNOSIS — Z1231 Encounter for screening mammogram for malignant neoplasm of breast: Secondary | ICD-10-CM | POA: Insufficient documentation

## 2021-10-16 DIAGNOSIS — Z1231 Encounter for screening mammogram for malignant neoplasm of breast: Secondary | ICD-10-CM | POA: Diagnosis not present

## 2021-10-16 DIAGNOSIS — Z01419 Encounter for gynecological examination (general) (routine) without abnormal findings: Secondary | ICD-10-CM | POA: Diagnosis not present

## 2021-10-25 DIAGNOSIS — M62830 Muscle spasm of back: Secondary | ICD-10-CM | POA: Diagnosis not present

## 2021-10-25 DIAGNOSIS — M9903 Segmental and somatic dysfunction of lumbar region: Secondary | ICD-10-CM | POA: Diagnosis not present

## 2021-10-25 DIAGNOSIS — M9905 Segmental and somatic dysfunction of pelvic region: Secondary | ICD-10-CM | POA: Diagnosis not present

## 2021-10-25 DIAGNOSIS — M5416 Radiculopathy, lumbar region: Secondary | ICD-10-CM | POA: Diagnosis not present

## 2021-12-06 DIAGNOSIS — M9903 Segmental and somatic dysfunction of lumbar region: Secondary | ICD-10-CM | POA: Diagnosis not present

## 2021-12-06 DIAGNOSIS — M62830 Muscle spasm of back: Secondary | ICD-10-CM | POA: Diagnosis not present

## 2021-12-06 DIAGNOSIS — M9905 Segmental and somatic dysfunction of pelvic region: Secondary | ICD-10-CM | POA: Diagnosis not present

## 2021-12-06 DIAGNOSIS — M5416 Radiculopathy, lumbar region: Secondary | ICD-10-CM | POA: Diagnosis not present

## 2022-01-11 DIAGNOSIS — M9903 Segmental and somatic dysfunction of lumbar region: Secondary | ICD-10-CM | POA: Diagnosis not present

## 2022-01-11 DIAGNOSIS — M62830 Muscle spasm of back: Secondary | ICD-10-CM | POA: Diagnosis not present

## 2022-01-11 DIAGNOSIS — M9905 Segmental and somatic dysfunction of pelvic region: Secondary | ICD-10-CM | POA: Diagnosis not present

## 2022-01-11 DIAGNOSIS — M5416 Radiculopathy, lumbar region: Secondary | ICD-10-CM | POA: Diagnosis not present

## 2022-02-15 DIAGNOSIS — M9905 Segmental and somatic dysfunction of pelvic region: Secondary | ICD-10-CM | POA: Diagnosis not present

## 2022-02-15 DIAGNOSIS — M9903 Segmental and somatic dysfunction of lumbar region: Secondary | ICD-10-CM | POA: Diagnosis not present

## 2022-02-15 DIAGNOSIS — M62830 Muscle spasm of back: Secondary | ICD-10-CM | POA: Diagnosis not present

## 2022-02-15 DIAGNOSIS — M5416 Radiculopathy, lumbar region: Secondary | ICD-10-CM | POA: Diagnosis not present

## 2022-03-29 DIAGNOSIS — M62830 Muscle spasm of back: Secondary | ICD-10-CM | POA: Diagnosis not present

## 2022-03-29 DIAGNOSIS — M9905 Segmental and somatic dysfunction of pelvic region: Secondary | ICD-10-CM | POA: Diagnosis not present

## 2022-03-29 DIAGNOSIS — M5416 Radiculopathy, lumbar region: Secondary | ICD-10-CM | POA: Diagnosis not present

## 2022-03-29 DIAGNOSIS — M9903 Segmental and somatic dysfunction of lumbar region: Secondary | ICD-10-CM | POA: Diagnosis not present

## 2022-05-03 DIAGNOSIS — M62830 Muscle spasm of back: Secondary | ICD-10-CM | POA: Diagnosis not present

## 2022-05-03 DIAGNOSIS — M5416 Radiculopathy, lumbar region: Secondary | ICD-10-CM | POA: Diagnosis not present

## 2022-05-03 DIAGNOSIS — M9905 Segmental and somatic dysfunction of pelvic region: Secondary | ICD-10-CM | POA: Diagnosis not present

## 2022-05-03 DIAGNOSIS — M9903 Segmental and somatic dysfunction of lumbar region: Secondary | ICD-10-CM | POA: Diagnosis not present

## 2022-05-22 DIAGNOSIS — Z0189 Encounter for other specified special examinations: Secondary | ICD-10-CM | POA: Diagnosis not present

## 2022-05-30 DIAGNOSIS — M62830 Muscle spasm of back: Secondary | ICD-10-CM | POA: Diagnosis not present

## 2022-05-30 DIAGNOSIS — M9903 Segmental and somatic dysfunction of lumbar region: Secondary | ICD-10-CM | POA: Diagnosis not present

## 2022-05-30 DIAGNOSIS — M9905 Segmental and somatic dysfunction of pelvic region: Secondary | ICD-10-CM | POA: Diagnosis not present

## 2022-05-30 DIAGNOSIS — M5416 Radiculopathy, lumbar region: Secondary | ICD-10-CM | POA: Diagnosis not present

## 2022-07-05 DIAGNOSIS — M5416 Radiculopathy, lumbar region: Secondary | ICD-10-CM | POA: Diagnosis not present

## 2022-07-05 DIAGNOSIS — M62830 Muscle spasm of back: Secondary | ICD-10-CM | POA: Diagnosis not present

## 2022-07-05 DIAGNOSIS — M9905 Segmental and somatic dysfunction of pelvic region: Secondary | ICD-10-CM | POA: Diagnosis not present

## 2022-07-05 DIAGNOSIS — M9903 Segmental and somatic dysfunction of lumbar region: Secondary | ICD-10-CM | POA: Diagnosis not present

## 2022-08-03 DIAGNOSIS — D225 Melanocytic nevi of trunk: Secondary | ICD-10-CM | POA: Diagnosis not present

## 2022-08-03 DIAGNOSIS — D2262 Melanocytic nevi of left upper limb, including shoulder: Secondary | ICD-10-CM | POA: Diagnosis not present

## 2022-08-03 DIAGNOSIS — L700 Acne vulgaris: Secondary | ICD-10-CM | POA: Diagnosis not present

## 2022-08-03 DIAGNOSIS — L281 Prurigo nodularis: Secondary | ICD-10-CM | POA: Diagnosis not present

## 2022-08-09 DIAGNOSIS — M5416 Radiculopathy, lumbar region: Secondary | ICD-10-CM | POA: Diagnosis not present

## 2022-08-09 DIAGNOSIS — M62830 Muscle spasm of back: Secondary | ICD-10-CM | POA: Diagnosis not present

## 2022-08-09 DIAGNOSIS — M9905 Segmental and somatic dysfunction of pelvic region: Secondary | ICD-10-CM | POA: Diagnosis not present

## 2022-08-09 DIAGNOSIS — M9903 Segmental and somatic dysfunction of lumbar region: Secondary | ICD-10-CM | POA: Diagnosis not present

## 2022-10-02 DIAGNOSIS — M9905 Segmental and somatic dysfunction of pelvic region: Secondary | ICD-10-CM | POA: Diagnosis not present

## 2022-10-02 DIAGNOSIS — M5416 Radiculopathy, lumbar region: Secondary | ICD-10-CM | POA: Diagnosis not present

## 2022-10-02 DIAGNOSIS — M9903 Segmental and somatic dysfunction of lumbar region: Secondary | ICD-10-CM | POA: Diagnosis not present

## 2022-10-02 DIAGNOSIS — M62830 Muscle spasm of back: Secondary | ICD-10-CM | POA: Diagnosis not present

## 2022-10-22 ENCOUNTER — Other Ambulatory Visit: Payer: Self-pay | Admitting: Certified Nurse Midwife

## 2022-10-22 DIAGNOSIS — Z1231 Encounter for screening mammogram for malignant neoplasm of breast: Secondary | ICD-10-CM

## 2022-10-29 DIAGNOSIS — N3946 Mixed incontinence: Secondary | ICD-10-CM | POA: Diagnosis not present

## 2022-10-29 DIAGNOSIS — N3281 Overactive bladder: Secondary | ICD-10-CM | POA: Diagnosis not present

## 2022-11-02 ENCOUNTER — Ambulatory Visit
Admission: RE | Admit: 2022-11-02 | Discharge: 2022-11-02 | Disposition: A | Discharge status: Home / Self Care | Payer: BC Managed Care – PPO | Source: Ambulatory Visit | Attending: Certified Nurse Midwife | Admitting: Certified Nurse Midwife

## 2022-11-02 DIAGNOSIS — Z1231 Encounter for screening mammogram for malignant neoplasm of breast: Secondary | ICD-10-CM

## 2022-11-13 DIAGNOSIS — M62830 Muscle spasm of back: Secondary | ICD-10-CM | POA: Diagnosis not present

## 2022-11-13 DIAGNOSIS — M9905 Segmental and somatic dysfunction of pelvic region: Secondary | ICD-10-CM | POA: Diagnosis not present

## 2022-11-13 DIAGNOSIS — M5416 Radiculopathy, lumbar region: Secondary | ICD-10-CM | POA: Diagnosis not present

## 2022-11-13 DIAGNOSIS — M9903 Segmental and somatic dysfunction of lumbar region: Secondary | ICD-10-CM | POA: Diagnosis not present

## 2022-12-11 DIAGNOSIS — M9905 Segmental and somatic dysfunction of pelvic region: Secondary | ICD-10-CM | POA: Diagnosis not present

## 2022-12-11 DIAGNOSIS — M9903 Segmental and somatic dysfunction of lumbar region: Secondary | ICD-10-CM | POA: Diagnosis not present

## 2022-12-11 DIAGNOSIS — M5416 Radiculopathy, lumbar region: Secondary | ICD-10-CM | POA: Diagnosis not present

## 2022-12-11 DIAGNOSIS — M62830 Muscle spasm of back: Secondary | ICD-10-CM | POA: Diagnosis not present

## 2023-01-22 DIAGNOSIS — M9905 Segmental and somatic dysfunction of pelvic region: Secondary | ICD-10-CM | POA: Diagnosis not present

## 2023-01-22 DIAGNOSIS — M9903 Segmental and somatic dysfunction of lumbar region: Secondary | ICD-10-CM | POA: Diagnosis not present

## 2023-01-22 DIAGNOSIS — M5416 Radiculopathy, lumbar region: Secondary | ICD-10-CM | POA: Diagnosis not present

## 2023-01-22 DIAGNOSIS — M62830 Muscle spasm of back: Secondary | ICD-10-CM | POA: Diagnosis not present

## 2023-02-19 DIAGNOSIS — M62830 Muscle spasm of back: Secondary | ICD-10-CM | POA: Diagnosis not present

## 2023-02-19 DIAGNOSIS — M5416 Radiculopathy, lumbar region: Secondary | ICD-10-CM | POA: Diagnosis not present

## 2023-02-19 DIAGNOSIS — M9903 Segmental and somatic dysfunction of lumbar region: Secondary | ICD-10-CM | POA: Diagnosis not present

## 2023-02-19 DIAGNOSIS — M9905 Segmental and somatic dysfunction of pelvic region: Secondary | ICD-10-CM | POA: Diagnosis not present

## 2023-06-11 LAB — BASIC METABOLIC PANEL WITH GFR
A1c: 6.2
BUN: 18 (ref 4–21)
Chloride: 102 (ref 99–108)
Creatinine: 1.1 (ref 0.5–1.1)
EGFR (Non-African Amer.): 60
Glucose: 99
Potassium: 4.6 meq/L (ref 3.5–5.1)
Sodium: 141 (ref 137–147)
TSH: 3.8 (ref 0.41–5.90)

## 2023-06-11 LAB — LIPID PANEL
Cholesterol: 165 (ref 0–200)
HDL: 77 — AB (ref 35–70)
LDL Cholesterol: 77
LDl/HDL Ratio: 2.1
Triglycerides: 53 (ref 40–160)

## 2023-06-11 LAB — CBC AND DIFFERENTIAL
HCT: 45 (ref 36–46)
Hemoglobin: 14.2 (ref 12.0–16.0)
Neutrophils Absolute: 55
Platelets: 282 K/uL (ref 150–400)
WBC: 5.3

## 2023-06-11 LAB — COMPREHENSIVE METABOLIC PANEL WITH GFR
Albumin: 4.6 (ref 3.5–5.0)
Calcium: 10 (ref 8.7–10.7)
Globulin: 2.8
eGFR: 60

## 2023-06-11 LAB — HEPATIC FUNCTION PANEL
ALT: 19 U/L (ref 7–35)
AST: 25 (ref 13–35)
Alkaline Phosphatase: 74 (ref 25–125)
Bilirubin, Total: 0.5

## 2023-06-11 LAB — VITAMIN D 25 HYDROXY (VIT D DEFICIENCY, FRACTURES): Vit D, 25-Hydroxy: 32.1

## 2023-06-11 LAB — CBC: RBC: 4.68 (ref 3.87–5.11)

## 2023-06-11 LAB — IRON,TIBC AND FERRITIN PANEL: Iron: 68

## 2023-06-11 LAB — TSH: TSH: 3.8 (ref 0.41–5.90)

## 2023-10-21 ENCOUNTER — Telehealth: Payer: Self-pay

## 2023-10-21 NOTE — Telephone Encounter (Signed)
 Copied from CRM [id number]. Topic: Appointments - Appointment Scheduling >> Oct 21, 2023  1:39 PM Clydene Darner H wrote: Patient called today regarding a transfer of care from her previous PCP at the same office. She was informed that her previous provider is no longer at the location. After reviewing the available providers' schedules, I informed the patient that the next openings are not until September, December, or even next year. I explained multiple times that I would need to send a message to the clinic for further assistance in trying to get her scheduled sooner.Please advise the patient.   Callback Number: [phone] if you're unable to reach the patient, please leave an voicemail per patient request.

## 2023-10-22 NOTE — Telephone Encounter (Signed)
 Noted.

## 2023-10-23 ENCOUNTER — Other Ambulatory Visit: Payer: Self-pay | Admitting: Certified Nurse Midwife

## 2023-10-23 DIAGNOSIS — Z1231 Encounter for screening mammogram for malignant neoplasm of breast: Secondary | ICD-10-CM

## 2023-10-29 ENCOUNTER — Ambulatory Visit: Admitting: Family Medicine

## 2023-11-04 ENCOUNTER — Ambulatory Visit
Admission: RE | Admit: 2023-11-04 | Discharge: 2023-11-04 | Disposition: A | Discharge status: Home / Self Care | Source: Ambulatory Visit | Attending: Certified Nurse Midwife | Admitting: Certified Nurse Midwife

## 2023-11-04 DIAGNOSIS — Z1231 Encounter for screening mammogram for malignant neoplasm of breast: Secondary | ICD-10-CM | POA: Insufficient documentation

## 2023-12-31 ENCOUNTER — Ambulatory Visit (INDEPENDENT_AMBULATORY_CARE_PROVIDER_SITE_OTHER): Payer: Self-pay | Admitting: Nurse Practitioner

## 2023-12-31 VITALS — BP 120/82 | HR 71 | Temp 97.8°F | Ht 66.5 in | Wt 186.2 lb

## 2023-12-31 DIAGNOSIS — Z0001 Encounter for general adult medical examination with abnormal findings: Secondary | ICD-10-CM

## 2023-12-31 DIAGNOSIS — R7303 Prediabetes: Secondary | ICD-10-CM

## 2023-12-31 DIAGNOSIS — M25512 Pain in left shoulder: Secondary | ICD-10-CM

## 2023-12-31 DIAGNOSIS — G8929 Other chronic pain: Secondary | ICD-10-CM | POA: Diagnosis not present

## 2023-12-31 NOTE — Progress Notes (Signed)
 Leron Glance, NP-C Phone: [phone]  Karen Chapman is a 63 y.o. female who presents today for transfer of care.   Discussed the use of AI scribe software for clinical note transcription with the patient, who gave verbal consent to proceed.  History of Present Illness   Karen Chapman is a 63 year old female who presents for transfer of care and annual exam with left shoulder pain.  She experiences increasing pain in her left shoulder, which has worsened over time. Initially, the pain was occasional, but it has progressed to the point where she can no longer exercise. The pain is exacerbated by certain movements, particularly reaching and rotational movements, and sometimes radiates down into her bicep. She also experiences numbness in her pinky and ring finger, primarily at night, which she attributes to her sleeping position.  She is unable to sleep on her left side due to the shoulder pain and finds some relief when lying on her back or right side with her shoulder in a specific position. The pain significantly impacts her daily activities, such as reaching above her head, doing her hair, showering, and drying her back. She describes difficulty with tasks like cooking and chopping vegetables, noting that keeping her elbow close to her body reduces discomfort.  She has a history of knee pain, which she manages with compression knee supports and exercises, allowing her to continue playing pickleball. However, her primary concern remains her shoulder due to its impact on her daily life.  She started using estrogen cream in May after an inconclusive Pap smear due to insufficient cells. She plans to repeat the Pap smear after completing the estrogen treatment. Her past medical history includes a normal colonoscopy in 2022 and a normal mammogram in May. She has a family history of colon polyps and breast cancer in both paternal grandparents.  She reports occasional abdominal pain related to dietary  choices, particularly after consuming junk food. No significant issues with bowel movements, urination, or persistent headaches. Her social history includes early retirement due to company downsizing, and she engages in activities like pickleball to stay active and socially connected. She is involved in community activities and plans to participate in a local citizen's academy.  She reports good sleep quality, typically waking once per night to use the bathroom. She is working on dietary changes to address a slightly elevated A1c level of 6.2, indicating prediabetes.      Social History   Tobacco Use  Smoking Status Never  Smokeless Tobacco Never    Current Outpatient Medications on File Prior to Visit  Medication Sig Dispense Refill   estradiol (ESTRACE) 0.1 MG/GM vaginal cream Place 1 Applicatorful vaginally 3 (three) times a week.     Multiple Vitamin (MULTIVITAMIN) tablet Take 1 tablet by mouth daily.     Vitamin D , Cholecalciferol, 1000 units CAPS Take 1 capsule by mouth daily.     No current facility-administered medications on file prior to visit.     ROS see history of present illness  Objective  Physical Exam Vitals:   12/31/23 1119  BP: 120/82  Pulse: 71  Temp: 97.8 F (36.6 C)  SpO2: 97%    BP Readings from Last 3 Encounters:  12/31/23 120/82  04/20/21 132/83  03/06/21 140/76   Wt Readings from Last 3 Encounters:  12/31/23 186 lb 3.2 oz (84.5 kg)  04/20/21 186 lb 6.4 oz (84.6 kg)  03/06/21 180 lb (81.6 kg)    Physical Exam Constitutional:  General: She is not in acute distress.    Appearance: Normal appearance.  HENT:     Head: Normocephalic.     Right Ear: Tympanic membrane normal.     Left Ear: Tympanic membrane normal.     Nose: Nose normal.     Mouth/Throat:     Mouth: Mucous membranes are moist.     Pharynx: Oropharynx is clear.  Eyes:     Conjunctiva/sclera: Conjunctivae normal.     Pupils: Pupils are equal, round, and reactive to  light.  Neck:     Thyroid : No thyromegaly.  Cardiovascular:     Rate and Rhythm: Normal rate and regular rhythm.     Heart sounds: Normal heart sounds.  Pulmonary:     Effort: Pulmonary effort is normal.     Breath sounds: Normal breath sounds.  Abdominal:     General: Abdomen is flat. Bowel sounds are normal.     Palpations: Abdomen is soft. There is no mass.     Tenderness: There is no abdominal tenderness.  Musculoskeletal:     Left shoulder: Tenderness present. Decreased range of motion. Decreased strength.  Lymphadenopathy:     Cervical: No cervical adenopathy.  Skin:    General: Skin is warm and dry.     Findings: No rash.  Neurological:     General: No focal deficit present.     Mental Status: She is alert.  Psychiatric:        Mood and Affect: Mood normal.        Behavior: Behavior normal.      Assessment/Plan: Please see individual problem list.  Encounter for routine adult health examination with abnormal findings Assessment & Plan: Physical exam complete. Lab work is up to date. We will plan to repeat in December. Recent unsatisfactory Pap smear with Gyn. Mammogram and colonoscopy are up to date. Tetanus vaccine is up to date. She declined the shingles, flu and COVID vaccines. Continue estrogen cream and follow up with GYN for a repeat Pap smear in September. Continue routine dental and eye exams. Encourage healthy diet and regular exercise. Return to care in one year, sooner as needed.    Chronic left shoulder pain Assessment & Plan: Chronic pain with radiating symptoms affects her daily activities and may be positional during sleep. An orthopedic evaluation is scheduled for tomorrow. Follow up with Dr. Tobie.    Prediabetes Assessment & Plan: Hemoglobin A1c is elevated at 6.2%. Discussed dietary and exercise modifications for glucose management and weight loss. Implement a low-carb, high-protein diet. Monitor portion sizes and caloric intake. Increase physical  activity. Consider alternatives like protein pasta and cauliflower-based substitutes. We will plan to recheck A1c in the future to monitor.      Return in about 1 year (around 12/30/2024) for Annual Exam, sooner as needed.   Leron Glance, NP-C [facility]

## 2024-01-06 ENCOUNTER — Encounter: Payer: Self-pay | Admitting: Nurse Practitioner

## 2024-01-06 DIAGNOSIS — G8929 Other chronic pain: Secondary | ICD-10-CM | POA: Insufficient documentation

## 2024-01-06 DIAGNOSIS — R7303 Prediabetes: Secondary | ICD-10-CM | POA: Insufficient documentation

## 2024-01-06 DIAGNOSIS — Z0001 Encounter for general adult medical examination with abnormal findings: Secondary | ICD-10-CM | POA: Insufficient documentation

## 2024-01-06 NOTE — Assessment & Plan Note (Addendum)
 Hemoglobin A1c is elevated at 6.2%. Discussed dietary and exercise modifications for glucose management and weight loss. Implement a low-carb, high-protein diet. Monitor portion sizes and caloric intake. Increase physical activity. Consider alternatives like protein pasta and cauliflower-based substitutes. We will plan to recheck A1c in the future to monitor.

## 2024-01-06 NOTE — Assessment & Plan Note (Signed)
 Physical exam complete. Lab work is up to date. We will plan to repeat in December. Recent unsatisfactory Pap smear with Gyn. Mammogram and colonoscopy are up to date. Tetanus vaccine is up to date. She declined the shingles, flu and COVID vaccines. Continue estrogen cream and follow up with GYN for a repeat Pap smear in September. Continue routine dental and eye exams. Encourage healthy diet and regular exercise. Return to care in one year, sooner as needed.

## 2024-01-06 NOTE — Assessment & Plan Note (Addendum)
 Chronic pain with radiating symptoms affects her daily activities and may be positional during sleep. An orthopedic evaluation is scheduled for tomorrow. Follow up with Dr. Tobie.

## 2025-01-05 ENCOUNTER — Encounter: Admitting: Nurse Practitioner
